# Patient Record
Sex: Male | Born: 1965 | Race: White | Hispanic: No | Marital: Married | Smoking: Current every day smoker
Health system: Southern US, Community
[De-identification: ages and names within clinical notes are randomized; demographics above are authoritative.]

## PROBLEM LIST (undated history)

## (undated) DIAGNOSIS — J42 Unspecified chronic bronchitis: Secondary | ICD-10-CM

## (undated) DIAGNOSIS — J449 Chronic obstructive pulmonary disease, unspecified: Secondary | ICD-10-CM

## (undated) DIAGNOSIS — N529 Male erectile dysfunction, unspecified: Secondary | ICD-10-CM

## (undated) DIAGNOSIS — I10 Essential (primary) hypertension: Secondary | ICD-10-CM

## (undated) DIAGNOSIS — R739 Hyperglycemia, unspecified: Secondary | ICD-10-CM

## (undated) DIAGNOSIS — Z720 Tobacco use: Secondary | ICD-10-CM

## (undated) HISTORY — DX: Male erectile dysfunction, unspecified: N52.9

## (undated) HISTORY — DX: Hyperglycemia, unspecified: R73.9

## (undated) HISTORY — DX: Unspecified chronic bronchitis: J42

## (undated) HISTORY — DX: Essential (primary) hypertension: I10

## (undated) HISTORY — DX: Chronic obstructive pulmonary disease, unspecified: J44.9

## (undated) HISTORY — DX: Tobacco use: Z72.0

---

## 2010-08-08 ENCOUNTER — Other Ambulatory Visit (HOSPITAL_COMMUNITY): Payer: Self-pay | Admitting: Pulmonary Disease

## 2010-08-08 DIAGNOSIS — R110 Nausea: Secondary | ICD-10-CM

## 2010-08-08 DIAGNOSIS — R140 Abdominal distension (gaseous): Secondary | ICD-10-CM

## 2010-08-14 ENCOUNTER — Ambulatory Visit (HOSPITAL_COMMUNITY)
Admission: RE | Admit: 2010-08-14 | Discharge: 2010-08-14 | Disposition: A | Discharge status: Home / Self Care | Payer: BC Managed Care – PPO | Source: Ambulatory Visit | Attending: Pulmonary Disease | Admitting: Pulmonary Disease

## 2010-08-14 DIAGNOSIS — R932 Abnormal findings on diagnostic imaging of liver and biliary tract: Secondary | ICD-10-CM | POA: Insufficient documentation

## 2010-08-14 DIAGNOSIS — R110 Nausea: Secondary | ICD-10-CM

## 2010-08-14 DIAGNOSIS — R141 Gas pain: Secondary | ICD-10-CM | POA: Insufficient documentation

## 2010-08-14 DIAGNOSIS — R142 Eructation: Secondary | ICD-10-CM | POA: Insufficient documentation

## 2010-08-14 DIAGNOSIS — R140 Abdominal distension (gaseous): Secondary | ICD-10-CM

## 2010-09-04 ENCOUNTER — Other Ambulatory Visit (HOSPITAL_COMMUNITY): Payer: Self-pay | Admitting: Pulmonary Disease

## 2010-09-04 DIAGNOSIS — R110 Nausea: Secondary | ICD-10-CM

## 2010-09-10 ENCOUNTER — Encounter (HOSPITAL_COMMUNITY): Payer: Self-pay

## 2010-09-10 ENCOUNTER — Encounter (HOSPITAL_COMMUNITY)
Admission: RE | Admit: 2010-09-10 | Discharge: 2010-09-10 | Disposition: A | Discharge status: Home / Self Care | Payer: BC Managed Care – PPO | Source: Ambulatory Visit | Attending: Pulmonary Disease | Admitting: Pulmonary Disease

## 2010-09-10 DIAGNOSIS — R110 Nausea: Secondary | ICD-10-CM

## 2010-09-10 DIAGNOSIS — R109 Unspecified abdominal pain: Secondary | ICD-10-CM | POA: Insufficient documentation

## 2010-09-10 MED ORDER — TECHNETIUM TC 99M MEBROFENIN IV KIT
5.0000 | PACK | Freq: Once | INTRAVENOUS | Status: AC | PRN
  Administered 2010-09-10: 5.5 via INTRAVENOUS

## 2010-10-08 ENCOUNTER — Encounter (HOSPITAL_COMMUNITY): Payer: BC Managed Care – PPO

## 2010-10-08 ENCOUNTER — Other Ambulatory Visit: Payer: Self-pay | Admitting: General Surgery

## 2010-10-08 LAB — HEPATIC FUNCTION PANEL
Indirect Bilirubin: 0.4 mg/dL (ref 0.3–0.9)
Total Protein: 6.5 g/dL (ref 6.0–8.3)

## 2010-10-08 LAB — CBC
MCH: 30.7 pg (ref 26.0–34.0)
MCHC: 34 g/dL (ref 30.0–36.0)
MCV: 90.2 fL (ref 78.0–100.0)
Platelets: 268 10*3/uL (ref 150–400)
RDW: 12.5 % (ref 11.5–15.5)

## 2010-10-08 LAB — BASIC METABOLIC PANEL
GFR calc non Af Amer: >60 mL/min (ref >60)
Glucose, Bld: 103 mg/dL — ABNORMAL HIGH (ref 70–99)
Potassium: 4.1 mEq/L (ref 3.5–5.1)
Sodium: 140 mEq/L (ref 135–145)

## 2010-10-08 LAB — DIFFERENTIAL
Basophils Absolute: 0.1 10*3/uL (ref 0.0–0.1)
Eosinophils Absolute: 0.2 10*3/uL (ref 0.0–0.7)
Eosinophils Relative: 3 % (ref 0–5)

## 2010-10-08 LAB — SURGICAL PCR SCREEN: Staphylococcus aureus: NEGATIVE

## 2010-10-16 ENCOUNTER — Other Ambulatory Visit (HOSPITAL_COMMUNITY): Payer: BC Managed Care – PPO

## 2010-10-19 ENCOUNTER — Other Ambulatory Visit: Payer: Self-pay | Admitting: General Surgery

## 2010-10-19 ENCOUNTER — Observation Stay (HOSPITAL_COMMUNITY)
Admission: RE | Admit: 2010-10-19 | Discharge: 2010-10-20 | Disposition: A | Discharge status: Home / Self Care | Payer: BC Managed Care – PPO | Source: Ambulatory Visit | Attending: General Surgery | Admitting: General Surgery

## 2010-10-19 DIAGNOSIS — K819 Cholecystitis, unspecified: Principal | ICD-10-CM | POA: Insufficient documentation

## 2010-10-19 DIAGNOSIS — Z79899 Other long term (current) drug therapy: Secondary | ICD-10-CM | POA: Insufficient documentation

## 2010-10-19 DIAGNOSIS — R1011 Right upper quadrant pain: Secondary | ICD-10-CM | POA: Insufficient documentation

## 2010-10-19 DIAGNOSIS — R110 Nausea: Secondary | ICD-10-CM | POA: Insufficient documentation

## 2010-10-20 LAB — DIFFERENTIAL
Basophils Absolute: 0 10*3/uL (ref 0.0–0.1)
Basophils Relative: 0 % (ref 0–1)
Monocytes Relative: 10 % (ref 3–12)
Neutro Abs: 10.5 10*3/uL — ABNORMAL HIGH (ref 1.7–7.7)
Neutrophils Relative %: 75 % (ref 43–77)

## 2010-10-20 LAB — COMPREHENSIVE METABOLIC PANEL
ALT: 49 U/L (ref 0–53)
AST: 45 U/L — ABNORMAL HIGH (ref 0–37)
CO2: 28 mEq/L (ref 19–32)
Chloride: 103 mEq/L (ref 96–112)
GFR calc Af Amer: >60 mL/min (ref >60)
GFR calc non Af Amer: >60 mL/min (ref >60)
Potassium: 3.9 mEq/L (ref 3.5–5.1)
Sodium: 139 mEq/L (ref 135–145)
Total Bilirubin: 0.7 mg/dL (ref 0.3–1.2)

## 2010-10-20 LAB — CBC
Hemoglobin: 13.5 g/dL (ref 13.0–17.0)
RBC: 4.49 MIL/uL (ref 4.22–5.81)
WBC: 14.1 10*3/uL — ABNORMAL HIGH (ref 4.0–10.5)

## 2010-10-31 NOTE — Op Note (Signed)
Donald Chavez, Donald Chavez               ACCOUNT NO.:  [id number]  MEDICAL RECORD NO.:  [id number]           PATIENT TYPE:  O  LOCATION:  A340                          FACILITY:  [facility]  PHYSICIAN:  Barbaraann Barthel, M.D. 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  PROCEDURE:  Laparoscopic cholecystectomy (no cholangiogram).  Note, this is a 45 year old white male who had postprandial pain located in the epigastrium and radiated to the back.  It was worked up by the medical service and was noted to have a sonogram that had no stones noted within it.  He had a hepatobiliary scan, which was very positive for symptoms for biliary dyskinesia having a ejection fraction less than1.  He was referred for gallbladder surgery.  Clinically, his symptoms were that of gallbladder disease.  I discussed complications with him discussing bleeding, infection, damage to bile ducts, perforation of organs, and transitory diarrhea.  I also I discussed with him candidly that the results for cholecystectomy, for biliary dyskinesia were not as satisfying as for cholelithiasis.  We discussed this in detail and informed consent was obtained.  GROSS OPERATIVE FINDINGS:  The patient had some dilated loops of small bowel.  The gallbladder had a long cystic duct, which was not cannulated.  There were no stones within the gallbladder.  The right upper quadrant otherwise appeared normal, and there were no other abnormalities noted.  TECHNIQUE:  The patient was placed in supine position. After the adequate administration of general anesthesia via enddotrachial intubation, a Foley catheter was placed.  A periumbilical incision was carried out over the  superior aspect of the umbilicus.  The fascia was grasped with a sharp towel clip and elevated.  A Veress needle was inserted, and a saline drop test was performed.  This was a little sluggish.  I reinserted this again after infusing.  However, I did not like the rate at which the infusions were carried out and reinserted this.  We were in the round ligament, which insufflated, and we repositioned this, and there was no problem with flow.  We then used the Visiport technique and placed 11-mm cannulae in the umbilicus and then another 11-mm cannula in the epigastrium, and two 5-mm cannulas in right upper quadrant laterally.  The gallbladder was grasped.  Its adhesions were taken down, and the cystic duct was clearly visualized and triply silver clipped and divided, as was the cystic artery.  This was then removed from the liver bed with very minimal spillage using the hook cautery device.  I irrigated copiously with normal saline solution.  There were no problems.  I elected to leave a Surgicel within the liver bed and a Jackson-Pratt drain there as well. I looked at the dilated loops of small bowel.  There was no evidence of any trauma to this, and he had some minimally dilated loops of small intestine, but there was obviously no trauma  from insertion of the needle.  There was obviously some bullous changes of the parietal peritoneum.  There was no problem.  After checking for hemostasis and checking for any abnormalities, we then desufflated the abdomen and then closed the fascia in the area of the epigastrium and the umbilicus with 0 Polysorb and then used 0.5% Sensorcaine at all drain sites, and closed the skin with a stapling device.  The drain was sutured in place with 3- 0 nylon and prior to closure, all sponge, needle, and instrument counts were found to be correct.  The patient received approximately 1200 mL of crystalloids intraoperatively.  There was minimal spillage.  The  wound classification is clean-contaminated (minimal bile spillage).  The specimen was a gallbladder. The patient was taken to the recovery room in satisfactory condition.  There were no complications.     Barbaraann Barthel, M.D.     WB/MEDQ  D:  10/19/2010  T:  10/20/2010  Job:  [id number]  Electronically Signed by Barbaraann Barthel M.D. on 10/31/2010 01:47:29 PM

## 2010-10-31 NOTE — Discharge Summary (Signed)
  [name], Donald Chavez               ACCOUNT NO.:  [id number]  MEDICAL RECORD NO.:  [id number]           PATIENT TYPE:  O  LOCATION:  A340                          FACILITY:  [facility]  PHYSICIAN:  Barbaraann Barthel, M.D. 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  PROCEDURE:  On October 19, 2010, laparoscopic cholecystectomy.  NOTE:  This is a 45 year old white male who had recurrent episodes of right upper quadrant pain and nausea that was postprandial in nature. It was located in the epigastrium and radiated to the back.  He was worked up by the medical service and referred to me for cholecystectomy. In essence, his sonogram was negative.  His liver function studies were grossly within normal limits.  However, his hepatobiliary scan showed that he had less than 1% ejection fraction and we discussed the need for laparoscopic cholecystectomy when dietary manipulation did not help him. We discussed complications not limited to, but including bleeding, infection, damage to bile ducts, perforation of organs and transitory diarrhea.  We also discussed that cholecystectomy for biliary dyskinesia does not have results as good as for cholelithiasis.  Informed consent was obtained.  The patient was taken to surgery via the outpatient department.  This was completely uneventful.  He was discharged on the following first postoperative day at which time he was tolerating p.o. well.  He was afebrile.  His wound was clean.  His abdomen was soft.  He had minimal serosanguineous JP drainage which was removed prior to discharge and he had no leg pain, shortness of breath or dysuria.  His hospital course was completely uneventful and he was doing quite well postoperatively.  DISCHARGE INSTRUCTIONS:  He was discharged on a full liquid and soft diet.  He is told  to do no heavy lifting greater than 10 or 15 pounds. He is permitted to go indoors and outdoors and up and down the stairs. He is told to do no driving and no sexual activity in the immediate postoperative period, told to keep his wound clean with alcohol 3 times a day and change his drain site as needed.  He is to follow up with me on October 26, 2010, at 10 a.m.  He is to call me or come to the emergency room should there be any acute changes.     Barbaraann Barthel, M.D.     WB/MEDQ  D:  10/20/2010  T:  10/20/2010  Job:  [id number]  cc:   Ramon Dredge L. Juanetta Gosling, M.D. Fax: [phone]  Electronically Signed by Barbaraann Barthel M.D. on 10/31/2010 01:48:56 PM

## 2013-03-12 IMAGING — NM NM HEPATO W/GB/PHARM/[PERSON_NAME]
2 series · 12 of 12 positions shown · non-contrast
Comparison: Abdominal ultrasound 08/14/2010.

CLINICAL DATA: Abdominal pain and nausea.

NUCLEAR MEDICINE HEPATOBILIARY IMAGING WITH GALLBLADDER EF
TECHNIQUE: Sequential images of the abdomen were obtained [DATE]
minutes following intravenous administration of
radiopharmaceutical.  After the slow intravenous infusion of
uCg Cholecystokinin, the gallbladder ejection fraction was
determined.
Radiopharmaceutical:  5.5 mCi Gc-MMm Choletec

[gb hepatobiliary scan · 3.19mm/px · 6 of 60 frames shown (1 of 2)]
[frame 6/60]
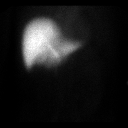
[frame 16/60]
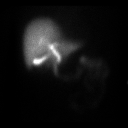
[frame 26/60]
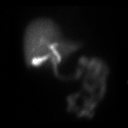
[frame 36/60]
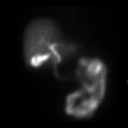
[frame 46/60]
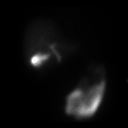
[frame 56/60]
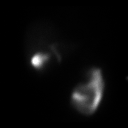

[gb hepatobiliary scan · 3.19mm/px · 6 of 30 frames shown (2 of 2)]
[frame 3/30]
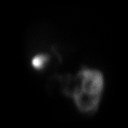
[frame 8/30]
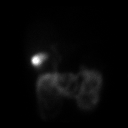
[frame 13/30]
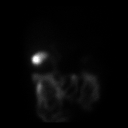
[frame 18/30]
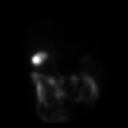
[frame 23/30]
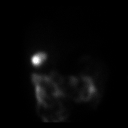
[frame 28/30]
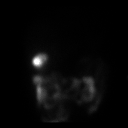

[12 of 12 positions shown; findings below may reference images not displayed]

FINDINGS: Initial images demonstrate homogenous hepatic activity
and prompt visualization of the gallbladder and biliary system.

The stimulated portion of the study demonstrates progressive small
bowel activity but effectively no gallbladder contraction.  The
gallbladder ejection fraction calculated at approximately 30
minutes is 0.9%.  Normal ejection fractions are considered greater
than 30%.

The patient did not experience symptoms  during CCK administration.
IMPRESSION: 1.  The cystic and common bile ducts are patent.
2.  There is effectively no gallbladder contraction following
cholecystokinin administration.  This did not reproduce the
patient's symptoms however.

## 2018-04-15 ENCOUNTER — Ambulatory Visit (INDEPENDENT_AMBULATORY_CARE_PROVIDER_SITE_OTHER): Payer: PRIVATE HEALTH INSURANCE | Admitting: Internal Medicine

## 2018-04-15 ENCOUNTER — Other Ambulatory Visit (INDEPENDENT_AMBULATORY_CARE_PROVIDER_SITE_OTHER): Payer: PRIVATE HEALTH INSURANCE

## 2018-04-15 ENCOUNTER — Encounter: Payer: Self-pay | Admitting: Internal Medicine

## 2018-04-15 ENCOUNTER — Ambulatory Visit (INDEPENDENT_AMBULATORY_CARE_PROVIDER_SITE_OTHER)
Admission: RE | Admit: 2018-04-15 | Discharge: 2018-04-15 | Disposition: A | Discharge status: Home / Self Care | Payer: PRIVATE HEALTH INSURANCE | Source: Ambulatory Visit | Attending: Internal Medicine | Admitting: Internal Medicine

## 2018-04-15 VITALS — BP 136/80 | HR 68 | Temp 98.9°F | Ht 77.0 in | Wt 204.2 lb

## 2018-04-15 DIAGNOSIS — Z0000 Encounter for general adult medical examination without abnormal findings: Secondary | ICD-10-CM

## 2018-04-15 DIAGNOSIS — R05 Cough: Secondary | ICD-10-CM

## 2018-04-15 DIAGNOSIS — R739 Hyperglycemia, unspecified: Secondary | ICD-10-CM

## 2018-04-15 DIAGNOSIS — R059 Cough, unspecified: Secondary | ICD-10-CM

## 2018-04-15 DIAGNOSIS — Z23 Encounter for immunization: Secondary | ICD-10-CM | POA: Diagnosis not present

## 2018-04-15 DIAGNOSIS — M25511 Pain in right shoulder: Secondary | ICD-10-CM | POA: Diagnosis not present

## 2018-04-15 DIAGNOSIS — F1210 Cannabis abuse, uncomplicated: Secondary | ICD-10-CM

## 2018-04-15 DIAGNOSIS — J410 Simple chronic bronchitis: Secondary | ICD-10-CM | POA: Diagnosis not present

## 2018-04-15 DIAGNOSIS — Z1211 Encounter for screening for malignant neoplasm of colon: Secondary | ICD-10-CM | POA: Insufficient documentation

## 2018-04-15 DIAGNOSIS — G8929 Other chronic pain: Secondary | ICD-10-CM

## 2018-04-15 DIAGNOSIS — Z720 Tobacco use: Secondary | ICD-10-CM

## 2018-04-15 LAB — CBC WITH DIFFERENTIAL/PLATELET
BASOS ABS: 0 10*3/uL (ref 0.0–0.1)
BASOS PCT: 0.3 % (ref 0.0–3.0)
EOS ABS: 0.3 10*3/uL (ref 0.0–0.7)
Eosinophils Relative: 2.7 % (ref 0.0–5.0)
HEMATOCRIT: 45.4 % (ref 39.0–52.0)
Hemoglobin: 15.4 g/dL (ref 13.0–17.0)
LYMPHS ABS: 3.1 10*3/uL (ref 0.7–4.0)
LYMPHS PCT: 31.4 % (ref 12.0–46.0)
MCHC: 34 g/dL (ref 30.0–36.0)
MCV: 87.8 fl (ref 78.0–100.0)
MONOS PCT: 8.9 % (ref 3.0–12.0)
Monocytes Absolute: 0.9 10*3/uL (ref 0.1–1.0)
NEUTROS ABS: 5.6 10*3/uL (ref 1.4–7.7)
NEUTROS PCT: 56.7 % (ref 43.0–77.0)
PLATELETS: 311 10*3/uL (ref 150.0–400.0)
RBC: 5.17 Mil/uL (ref 4.22–5.81)
RDW: 13 % (ref 11.5–15.5)
WBC: 9.8 10*3/uL (ref 4.0–10.5)

## 2018-04-15 LAB — COMPREHENSIVE METABOLIC PANEL
ALBUMIN: 4.4 g/dL (ref 3.5–5.2)
ALT: 17 U/L (ref 0–53)
AST: 15 U/L (ref 0–37)
Alkaline Phosphatase: 61 U/L (ref 39–117)
BUN: 20 mg/dL (ref 6–23)
CALCIUM: 9.2 mg/dL (ref 8.4–10.5)
CHLORIDE: 104 meq/L (ref 96–112)
CO2: 29 meq/L (ref 19–32)
Creatinine, Ser: 0.99 mg/dL (ref 0.40–1.50)
GFR: 83.99 mL/min (ref >60.00)
Glucose, Bld: 100 mg/dL — ABNORMAL HIGH (ref 70–99)
Potassium: 4.1 mEq/L (ref 3.5–5.1)
Sodium: 139 mEq/L (ref 135–145)
Total Bilirubin: 0.4 mg/dL (ref 0.2–1.2)
Total Protein: 7 g/dL (ref 6.0–8.3)

## 2018-04-15 LAB — LIPID PANEL
CHOL/HDL RATIO: 5
CHOLESTEROL: 168 mg/dL (ref 0–200)
HDL: 36.7 mg/dL — ABNORMAL LOW (ref >39.00)
NonHDL: 130.92
TRIGLYCERIDES: 206 mg/dL — AB (ref 0.0–149.0)
VLDL: 41.2 mg/dL — AB (ref 0.0–40.0)

## 2018-04-15 LAB — HEMOGLOBIN A1C: Hgb A1c MFr Bld: 5.7 % (ref 4.6–6.5)

## 2018-04-15 LAB — PSA: PSA: 0.61 ng/mL (ref 0.10–4.00)

## 2018-04-15 LAB — LDL CHOLESTEROL, DIRECT: Direct LDL: 101 mg/dL

## 2018-04-15 NOTE — Patient Instructions (Signed)

## 2018-04-15 NOTE — Progress Notes (Signed)
Subjective:  Patient ID: Donald Chavez, male    DOB: 11/20/1965  Age: 52 y.o. MRN: [id number]  CC: Cough and Annual Exam  NEW TO ME  HPI Donald Chavez presents for a CPX.  He complains of chronic nonproductive cough with rare episodes of shortness of breath.  He denies CP, DOE, palpitations, diaphoresis, dizziness, or lightheadedness.  History Arlys JohnBrian has no past medical history on file.   He has no past surgical history on file.   His family history includes Alcoholism in his father; CAD in his father; COPD in his father; Hypertension in his mother.He reports that he has been smoking cigarettes. He has a 49.50 pack-year smoking history. He has never used smokeless tobacco. He reports that he drinks about 5.0 standard drinks of alcohol per week. He reports that he has current or past drug history. Drug: Marijuana.  No outpatient medications prior to visit.   No facility-administered medications prior to visit.     ROS Review of Systems  Constitutional: Negative.  Negative for chills, diaphoresis, fatigue and unexpected weight change.  HENT: Negative.  Negative for trouble swallowing.   Eyes: Negative for visual disturbance.  Respiratory: Positive for cough and shortness of breath. Negative for chest tightness and wheezing.   Cardiovascular: Negative for chest pain, palpitations and leg swelling.  Gastrointestinal: Negative for abdominal pain, blood in stool, constipation, diarrhea, nausea and vomiting.  Genitourinary: Negative.  Negative for difficulty urinating, dysuria, scrotal swelling, testicular pain and urgency.  Musculoskeletal: Positive for arthralgias.       He complains of chronic, nontraumatic right shoulder pain he wants to see a specialist about this.  Skin: Negative.   Neurological: Negative.  Negative for dizziness, weakness and light-headedness.  Hematological: Negative for adenopathy. Does not bruise/bleed easily.  Psychiatric/Behavioral: Negative.      Objective:  BP 136/80 (BP Location: Left Arm, Patient Position: Sitting, Cuff Size: Normal)   Pulse 68   Temp 98.9 F (37.2 C) (Oral)   Ht 6\' 5"  (1.956 m)   Wt 204 lb 4 oz (92.6 kg)   SpO2 94%   BMI 24.22 kg/m   Physical Exam  Constitutional: He is oriented to person, place, and time. No distress.  HENT:  Mouth/Throat: Oropharynx is clear and moist. No oropharyngeal exudate.  Eyes: Conjunctivae are normal. No scleral icterus.  Neck: Normal range of motion. Neck supple. No JVD present. No thyromegaly present.  Cardiovascular: Normal rate, regular rhythm and normal heart sounds.  No murmur heard. Pulmonary/Chest: Effort normal. No respiratory distress. He has no decreased breath sounds. He has no wheezes. He has rhonchi in the right lower field and the left lower field. He has no rales.  There are faint inspiratory rhonchi heard symmetrically in both bases.  Abdominal: Soft. Bowel sounds are normal. He exhibits no mass. There is no hepatosplenomegaly. There is no tenderness. Hernia confirmed negative in the right inguinal area and confirmed negative in the left inguinal area.  Genitourinary: Rectum normal, prostate normal, testes normal and penis normal. Rectal exam shows no external hemorrhoid, no internal hemorrhoid, no fissure, no mass, no tenderness, anal tone normal and guaiac negative stool. Prostate is not enlarged and not tender. Right testis shows no mass, no swelling and no tenderness. Left testis shows no mass, no swelling and no tenderness. Circumcised. No penile erythema or penile tenderness. No discharge found.  Musculoskeletal: Normal range of motion. He exhibits no edema, tenderness or deformity.       Right shoulder: Normal.  Lymphadenopathy:    He has no cervical adenopathy. No inguinal adenopathy noted on the right or left side.  Neurological: He is alert and oriented to person, place, and time.  Skin: Skin is warm and dry. No rash noted. He is not diaphoretic.   Psychiatric: He has a normal mood and affect. His behavior is normal. Judgment and thought content normal.  Vitals reviewed.   Lab Results  Component Value Date   WBC 9.8 04/15/2018   HGB 15.4 04/15/2018   HCT 45.4 04/15/2018   PLT 311.0 04/15/2018   GLUCOSE 100 (H) 04/15/2018   CHOL 168 04/15/2018   TRIG 206.0 (H) 04/15/2018   HDL 36.70 (L) 04/15/2018   LDLDIRECT 101.0 04/15/2018   ALT 17 04/15/2018   AST 15 04/15/2018   NA 139 04/15/2018   K 4.1 04/15/2018   CL 104 04/15/2018   CREATININE 0.99 04/15/2018   BUN 20 04/15/2018   CO2 29 04/15/2018   PSA 0.61 04/15/2018   HGBA1C 5.7 04/15/2018    Assessment & Plan:   Krue was seen today for cough and annual exam.  Diagnoses and all orders for this visit:  Routine general medical examination at a health care facility- Exam completed, labs reviewed, vaccines reviewed and updated, to screen for colon cancer/polyps Cologuard was ordered, patient education material was given. -     Lipid panel; Future -     PSA; Future  Hyperglycemia -     Comprehensive metabolic panel; Future -     Hemoglobin A1c; Future  Need for influenza vaccination -     Flu Vaccine QUAD 36+ mos IM  Need for Tdap vaccination -     Tdap vaccine greater than or equal to 7yo IM  Cough- His chest x-ray is positive for signs of COPD but no mass or infiltrate. -     CBC with Differential/Platelet; Future -     DG Chest 2 View; Future  Chronic right shoulder pain -     Ambulatory referral to Sports Medicine  Colon cancer screening -     Cologuard  Tobacco abuse  Marijuana abuse, continuous  Simple chronic bronchitis (HCC)- He was asked to quit smoking.  He says his symptoms are not significant enough for him to use an inhaler to treat this.   Donald Drone Marquart does not currently have medications on file.  No orders of the defined types were placed in this encounter.    Follow-up: Return in about 3 months (around 07/16/2018).  Sanda Linger, MD

## 2018-04-17 ENCOUNTER — Encounter: Payer: Self-pay | Admitting: Internal Medicine

## 2018-04-17 DIAGNOSIS — F1210 Cannabis abuse, uncomplicated: Secondary | ICD-10-CM | POA: Insufficient documentation

## 2018-04-17 DIAGNOSIS — Z720 Tobacco use: Secondary | ICD-10-CM | POA: Insufficient documentation

## 2018-04-17 DIAGNOSIS — Z23 Encounter for immunization: Secondary | ICD-10-CM | POA: Insufficient documentation

## 2018-04-17 DIAGNOSIS — J410 Simple chronic bronchitis: Secondary | ICD-10-CM | POA: Insufficient documentation

## 2018-05-20 NOTE — Progress Notes (Signed)
[facility] [address] [place], [place] [address] Phone: [phone] Subjective:   Bruce Donath, am serving as a scribe for Dr. Antoine Primas.  I'm seeing this patient by the request  of:  Etta Grandchild, MD   CC: right shoulder pain   DDU:[id number]  Donald Chavez is a 53 y.o. male coming in with complaint of right shoulder pain. Pain has been occurring for past 2 months. No mechanism of injury. Does pick up diabled wife and feels pain may have come from taking care of her. Does have pain with flexion but pain is also present with his arm at his side. Does use NSAIDs and ice. Denies any radiating symptoms. Pain is located in posterior shoulder.  Patient has noted some decreased range of motion.  Waking him up at night.  Rates the severity of pain is 9 out of 10 sometimes      No past medical history on file. No past surgical history on file. Social History   Socioeconomic History  . Marital status: Married    Spouse name: Not on file  . Number of children: Not on file  . Years of education: Not on file  . Highest education level: Not on file  Occupational History  . Not on file  Social Needs  . Financial resource strain: Not on file  . Food insecurity:    Worry: Not on file    Inability: Not on file  . Transportation needs:    Medical: Not on file    Non-medical: Not on file  Tobacco Use  . Smoking status: Current Every Day Smoker    Packs/day: 1.50    Years: 33.00    Pack years: 49.50    Types: Cigarettes  . Smokeless tobacco: Never Used  Substance and Sexual Activity  . Alcohol use: Yes    Alcohol/week: 5.0 standard drinks    Types: 5 Shots of liquor per week  . Drug use: Yes    Types: Marijuana  . Sexual activity: Not Currently    Partners: Female  Lifestyle  . Physical activity:    Days per week: Not on file    Minutes per session: Not on file  . Stress: Not on file  Relationships  . Social connections:    Talks on  phone: Not on file    Gets together: Not on file    Attends religious service: Not on file    Active member of club or organization: Not on file    Attends meetings of clubs or organizations: Not on file    Relationship status: Not on file  Other Topics Concern  . Not on file  Social History Narrative  . Not on file   No Known Allergies Family History  Problem Relation Age of Onset  . Hypertension Mother   . COPD Father   . CAD Father   . Alcoholism Father    No current outpatient medications on file.    Past medical history, social, surgical and family history all reviewed in electronic medical record.  No pertanent information unless stated regarding to the chief complaint.   Review of Systems:  No headache, visual changes, nausea, vomiting, diarrhea, constipation, dizziness, abdominal pain, skin rash, fevers, chills, night sweats, weight loss, swollen lymph nodes, body aches, joint swelling, muscle aches, chest pain, shortness of breath, mood changes.   Objective  Blood pressure 118/72, pulse 64, height 6\' 5"  (1.956 m), weight 203 lb (92.1  kg), SpO2 97 %.    General: No apparent distress alert and oriented x3 mood and affect normal, dressed appropriately.  HEENT: Pupils equal, extraocular movements intact  Respiratory: Patient's speak in full sentences and does not appear short of breath  Cardiovascular: No lower extremity edema, non tender, no erythema  Skin: Warm dry intact with no signs of infection or rash on extremities or on axial skeleton.  Abdomen: Soft nontender  Neuro: Cranial nerves II through XII are intact, neurovascularly intact in all extremities with 2+ DTRs and 2+ pulses.  Lymph: No lymphadenopathy of posterior or anterior cervical chain or axillae bilaterally.  Gait normal with good balance and coordination.  MSK:  Non tender with full range of motion and good stability and symmetric strength and tone of  elbows, wrist, hip, knee and ankles bilaterally.    Shoulder: Right Inspection reveals no abnormalities, atrophy or asymmetry. Diffuse tenderness to palpation Patient has limited range of motion with only 5 degrees of external rotation and internal rotation anterior lateral hip.  Forward flexion 120 degrees. Rotator cuff strength normal throughout. signs of impingement with positive Neer and Hawkin's tests, but negative empty can sign. Speeds and Yergason's tests normal. No labral pathology noted with negative Obrien's, negative clunk and good stability. Mild scapular dysfunction noted. No painful arc and no drop arm sign. No apprehension sign Contralateral shoulder unremarkable  MSK US performed of: Right This study was ordered, performed, and interpreted by Terrilee FilesZach Tarnesha Ulloa D.O.  Shoulder:   Supraspinatus:  Appears normal on long and transverse views, Bursal bulge seen with shoulder abduction on impingement view.  Significant thickening of the anterior capsule. Minimal arthritic changes noted.  Subscapularis unremarkable but does have some thickness.   Impression: Subacromial bursitis  Procedure: Real-time Ultrasound Guided Injection of right glenohumeral joint Device: GE Logiq E  Ultrasound guided injection is preferred based studies that show increased duration, increased effect, greater accuracy, decreased procedural pain, increased response rate with ultrasound guided versus blind injection.  Verbal informed consent obtained.  Time-out conducted.  Noted no overlying erythema, induration, or other signs of local infection.  Skin prepped in a sterile fashion.  Local anesthesia: Topical Ethyl chloride.  With sterile technique and under real time ultrasound guidance:  Joint visualized.  23g 1  inch needle inserted posterior approach. Pictures taken for needle placement. Patient did have injection of 2 cc of 1% lidocaine, 2 cc of 0.5% Marcaine, and 1.0 cc of Kenalog 40 mg/dL. Completed without difficulty  Pain immediately resolved  suggesting accurate placement of the medication.  Advised to call if fevers/chills, erythema, induration, drainage, or persistent bleeding.  Images permanently stored and available for review in the ultrasound unit.  Impression: Technically successful ultrasound guided injection.   Impression and Recommendations:     This case required medical decision making of moderate complexity. The above documentation has been reviewed and is accurate and complete Judi SaaZachary M Chamar Broughton, DO       Note: This dictation was prepared with Dragon dictation along with smaller phrase technology. Any transcriptional errors that result from this process are unintentional.

## 2018-05-21 ENCOUNTER — Ambulatory Visit: Payer: Self-pay

## 2018-05-21 ENCOUNTER — Encounter: Payer: Self-pay | Admitting: Family Medicine

## 2018-05-21 ENCOUNTER — Ambulatory Visit (INDEPENDENT_AMBULATORY_CARE_PROVIDER_SITE_OTHER): Payer: PRIVATE HEALTH INSURANCE | Admitting: Family Medicine

## 2018-05-21 VITALS — BP 118/72 | HR 64 | Ht 77.0 in | Wt 203.0 lb

## 2018-05-21 DIAGNOSIS — G8929 Other chronic pain: Secondary | ICD-10-CM

## 2018-05-21 DIAGNOSIS — M7500 Adhesive capsulitis of unspecified shoulder: Secondary | ICD-10-CM | POA: Insufficient documentation

## 2018-05-21 DIAGNOSIS — M25511 Pain in right shoulder: Secondary | ICD-10-CM

## 2018-05-21 DIAGNOSIS — M7501 Adhesive capsulitis of right shoulder: Secondary | ICD-10-CM

## 2018-05-21 NOTE — Assessment & Plan Note (Signed)
Patient given injection.  Tolerated the procedure well.  Discussed icing regimen and home exercise.  Discussed which activities to do which wants to avoid.  Increase activity slowly over the course of next several days.  Follow-up with me again in 4 to 8 weeks.  Differential includes osteoarthritic changes but on ultrasound did not see.  Rotator cuff appeared to be intact.

## 2018-05-21 NOTE — Patient Instructions (Addendum)
Good to see you  Injected the shoulder for frozen shoulder  Ice 20 minutes 2 times daily. Usually after activity and before bed. pennsaid pinkie amount topically 2 times daily as needed.  Exercises 3 times a week. Try to move the shoulder daily  See me again in 5-6 weeks

## 2018-06-25 ENCOUNTER — Ambulatory Visit (INDEPENDENT_AMBULATORY_CARE_PROVIDER_SITE_OTHER): Payer: PRIVATE HEALTH INSURANCE | Admitting: Family Medicine

## 2018-06-25 ENCOUNTER — Encounter: Payer: Self-pay | Admitting: Family Medicine

## 2018-06-25 DIAGNOSIS — M25511 Pain in right shoulder: Secondary | ICD-10-CM

## 2018-06-25 DIAGNOSIS — G8929 Other chronic pain: Secondary | ICD-10-CM

## 2018-06-25 NOTE — Patient Instructions (Signed)
Good to see you Donald Chavez is your friend  Try to do the exercises 1 time a week  Call [phone] when you need me

## 2018-06-25 NOTE — Progress Notes (Signed)
[facility] [address] [place], [place] [address] Phone: [phone] Subjective:    I Donald Chavez am serving as a Neurosurgeon for Dr. Antoine Primas.    CC: shoulder pain follow up   JOA:[id number]    Updated 05/21/2018  Patient given injection.  Tolerated the procedure well.  Discussed icing regimen and home exercise.  Discussed which activities to do which wants to avoid.  Increase activity slowly over the course of next several days.  Follow-up with me again in 4 to 8 weeks.  Differential includes osteoarthritic changes but on ultrasound did not see.  Rotator cuff appeared to be intact.  Updated 06/25/2018  Ranell Alyea Agard is a 53 y.o. male coming in with complaint of right shoulder pain. Patient states that his shoulder is feeling good today. Patient states that he is approximately 90 to 95% better.  Patient states only some mild twinges from time to time.  Not waking him up at night.     No past medical history on file. No past surgical history on file. Social History   Socioeconomic History  . Marital status: Married    Spouse name: Not on file  . Number of children: Not on file  . Years of education: Not on file  . Highest education level: Not on file  Occupational History  . Not on file  Social Needs  . Financial resource strain: Not on file  . Food insecurity:    Worry: Not on file    Inability: Not on file  . Transportation needs:    Medical: Not on file    Non-medical: Not on file  Tobacco Use  . Smoking status: Current Every Day Smoker    Packs/day: 1.50    Years: 33.00    Pack years: 49.50    Types: Cigarettes  . Smokeless tobacco: Never Used  Substance and Sexual Activity  . Alcohol use: Yes    Alcohol/week: 5.0 standard drinks    Types: 5 Shots of liquor per week  . Drug use: Yes    Types: Marijuana  . Sexual activity: Not Currently    Partners: Female  Lifestyle  . Physical activity:    Days per week: Not on file    Minutes per session: Not on file  . Stress: Not on file  Relationships  . Social connections:    Talks on phone: Not on file    Gets together: Not on file    Attends religious service: Not on file    Active member of club or organization: Not on file    Attends meetings of clubs or organizations: Not on file    Relationship status: Not on file  Other Topics Concern  . Not on file  Social History Narrative  . Not on file   No Known Allergies Family History  Problem Relation Age of Onset  . Hypertension Mother   . COPD Father   . CAD Father   . Alcoholism Father    No current outpatient medications on file.    Past medical history, social, surgical and family history all reviewed in electronic medical record.  No pertanent information unless stated regarding to the chief complaint.   Review of Systems:  No headache, visual changes, nausea, vomiting, diarrhea, constipation, dizziness, abdominal pain, skin rash, fevers, chills, night sweats, weight loss, swollen lymph nodes, body aches, joint swelling, muscle aches, chest pain, shortness of breath, mood changes.   Objective  There were no vitals  taken for this visit. Systems examined below as of    General: No apparent distress alert and oriented x3 mood and affect normal, dressed appropriately.  HEENT: Pupils equal, extraocular movements intact  Respiratory: Patient's speak in full sentences and does not appear short of breath  Cardiovascular: No lower extremity edema, non tender, no erythema  Skin: Warm dry intact with no signs of infection or rash on extremities or on axial skeleton.  Abdomen: Soft nontender  Neuro: Cranial nerves II through XII are intact, neurovascularly intact in all extremities with 2+ DTRs and 2+ pulses.  Lymph: No lymphadenopathy of posterior or anterior cervical chain or axillae bilaterally.  Gait normal with good balance and coordination.  MSK:  Non tender with full range of motion and good  stability and symmetric strength and tone of , elbows, wrist, hip, knee and ankles bilaterally.  Shoulder: Right Inspection reveals no abnormalities, atrophy or asymmetry. Palpation is normal with no tenderness over AC joint or bicipital groove. Lacks last 5 degrees of forward flexion Rotator cuff strength normal throughout. Mild impingement Speeds and Yergason's tests normal. No labral pathology noted with negative Obrien's, negative clunk and good stability. Normal scapular function observed. No painful arc and no drop arm sign. No apprehension sign Shoulder shoulder unremarkable    Impression and Recommendations:    . The above documentation has been reviewed and is accurate and complete Judi Saa, DO       Note: This dictation was prepared with Dragon dictation along with smaller phrase technology. Any transcriptional errors that result from this process are unintentional.

## 2018-06-25 NOTE — Assessment & Plan Note (Addendum)
Possible frozen shoulder but no significant improvement in range of motion already.  Discussed icing regimen and home exercise.  Discussed which activities of doing which wants to avoid.  Follow-up again in 4 to 8 weeks if any worsening symptoms otherwise as needed

## 2018-09-23 ENCOUNTER — Other Ambulatory Visit: Payer: Self-pay

## 2018-09-23 ENCOUNTER — Encounter: Payer: Self-pay | Admitting: Internal Medicine

## 2018-09-23 ENCOUNTER — Ambulatory Visit (INDEPENDENT_AMBULATORY_CARE_PROVIDER_SITE_OTHER): Payer: PRIVATE HEALTH INSURANCE | Admitting: Internal Medicine

## 2018-09-23 VITALS — BP 144/86 | HR 67 | Temp 98.1°F | Resp 16 | Ht 77.0 in | Wt 200.8 lb

## 2018-09-23 DIAGNOSIS — R0789 Other chest pain: Secondary | ICD-10-CM | POA: Diagnosis not present

## 2018-09-23 DIAGNOSIS — R059 Cough, unspecified: Secondary | ICD-10-CM

## 2018-09-23 DIAGNOSIS — J441 Chronic obstructive pulmonary disease with (acute) exacerbation: Secondary | ICD-10-CM | POA: Diagnosis not present

## 2018-09-23 DIAGNOSIS — R05 Cough: Secondary | ICD-10-CM

## 2018-09-23 MED ORDER — UMECLIDINIUM-VILANTEROL 62.5-25 MCG/INH IN AEPB: 1.0000 | INHALATION_SPRAY | Freq: Every day | RESPIRATORY_TRACT | 1 refills | Status: DC

## 2018-09-23 MED ORDER — AZITHROMYCIN 500 MG PO TABS: 500.0000 mg | ORAL_TABLET | Freq: Every day | ORAL | 0 refills | Status: AC

## 2018-09-23 MED ORDER — PREDNISONE 50 MG PO TABS: 50.0000 mg | ORAL_TABLET | Freq: Every day | ORAL | 0 refills | Status: DC

## 2018-09-23 MED ORDER — ALBUTEROL SULFATE HFA 108 (90 BASE) MCG/ACT IN AERS: 2.0000 | INHALATION_SPRAY | Freq: Four times a day (QID) | RESPIRATORY_TRACT | 2 refills | Status: DC | PRN

## 2018-09-23 NOTE — Patient Instructions (Signed)

## 2018-09-23 NOTE — Progress Notes (Signed)
Subjective:  Patient ID: Donald Chavez, male    DOB: [birth year]  Age: 53 y.o. MRN: [id number]  CC: Cough   HPI Donald Chavez presents for concerns about a 2-day history of nonproductive cough, tightness under his breastbone, wheezing, and shortness of breath.  He says the tightness in his chest occurs at rest and is not exacerbated by activity.  He denies hemoptysis, diaphoresis, DOE, dizziness, lightheadedness, or nausea.  No outpatient medications prior to visit.   No facility-administered medications prior to visit.     ROS Review of Systems  Constitutional: Negative.  Negative for chills, fatigue and fever.  HENT: Negative.  Negative for sore throat and trouble swallowing.   Eyes: Negative for visual disturbance.  Respiratory: Positive for cough, chest tightness, shortness of breath and wheezing. Negative for stridor.   Cardiovascular: Negative for chest pain, palpitations and leg swelling.  Gastrointestinal: Negative for abdominal pain, constipation, diarrhea, nausea and vomiting.  Endocrine: Negative.   Genitourinary: Negative.  Negative for difficulty urinating, dysuria and hematuria.  Musculoskeletal: Negative.  Negative for arthralgias and myalgias.  Skin: Negative.  Negative for color change, pallor and rash.  Neurological: Negative.  Negative for dizziness, weakness and light-headedness.  Hematological: Negative for adenopathy. Does not bruise/bleed easily.  Psychiatric/Behavioral: Negative.     Objective:  BP (!) 144/86 (BP Location: Left Arm, Patient Position: Sitting, Cuff Size: Normal)   Pulse 67   Temp 98.1 F (36.7 C) (Oral)   Resp 16   Ht 6\' 5"  (1.956 m)   Wt 200 lb 12 oz (91.1 kg)   SpO2 95%   BMI 23.81 kg/m   BP Readings from Last 3 Encounters:  09/23/18 (!) 144/86  06/25/18 120/70  05/21/18 118/72    Wt Readings from Last 3 Encounters:  09/23/18 200 lb 12 oz (91.1 kg)  06/25/18 203 lb (92.1 kg)  05/21/18 203 lb (92.1 kg)    Physical  Exam Vitals signs reviewed.  Constitutional:      General: He is not in acute distress.    Appearance: He is not ill-appearing, toxic-appearing or diaphoretic.  HENT:     Nose: Nose normal. No congestion.     Mouth/Throat:     Mouth: Mucous membranes are moist.     Pharynx: No oropharyngeal exudate.  Eyes:     General: No scleral icterus.    Conjunctiva/sclera: Conjunctivae normal.  Neck:     Musculoskeletal: Normal range of motion and neck supple. No muscular tenderness.  Cardiovascular:     Rate and Rhythm: Normal rate and regular rhythm.     Heart sounds: No murmur. No gallop.      Comments: EKG ---  Sinus  Bradycardia  WITHIN NORMAL LIMITS Pulmonary:     Effort: Pulmonary effort is normal. No respiratory distress.     Breath sounds: No stridor. Examination of the right-middle field reveals wheezing and rhonchi. Examination of the left-middle field reveals rhonchi. Examination of the right-lower field reveals wheezing and rhonchi. Examination of the left-lower field reveals rhonchi. Wheezing and rhonchi present. No decreased breath sounds or rales.  Abdominal:     General: Abdomen is flat. Bowel sounds are normal.     Palpations: There is no mass.     Tenderness: There is no abdominal tenderness.  Musculoskeletal: Normal range of motion.     Right lower leg: No edema.     Left lower leg: No edema.  Lymphadenopathy:     Cervical: No cervical adenopathy.  Skin:  General: Skin is warm and dry.     Findings: No rash.  Neurological:     General: No focal deficit present.     Lab Results  Component Value Date   WBC 9.8 04/15/2018   HGB 15.4 04/15/2018   HCT 45.4 04/15/2018   PLT 311.0 04/15/2018   GLUCOSE 100 (H) 04/15/2018   CHOL 168 04/15/2018   TRIG 206.0 (H) 04/15/2018   HDL 36.70 (L) 04/15/2018   LDLDIRECT 101.0 04/15/2018   ALT 17 04/15/2018   AST 15 04/15/2018   NA 139 04/15/2018   K 4.1 04/15/2018   CL 104 04/15/2018   CREATININE 0.99 04/15/2018   BUN  20 04/15/2018   CO2 29 04/15/2018   PSA 0.61 04/15/2018   HGBA1C 5.7 04/15/2018    Dg Chest 2 View  Result Date: 04/15/2018 CLINICAL DATA:  Chronic cough.  Wheezing.  Smoker. EXAM: CHEST - 2 VIEW COMPARISON:  None. FINDINGS: The heart size and mediastinal contours are within normal limits. Both lungs are clear. Pulmonary hyperinflation noted, consistent with COPD. The visualized skeletal structures are unremarkable. IMPRESSION: COPD.  No active cardiopulmonary disease. Electronically Signed   By: Myles RosenthalJohn  Stahl M.D.   On: 04/15/2018 23:27    Assessment & Plan:   Donald Chavez was seen today for cough.  Diagnoses and all orders for this visit:  Chest pressure- The description of the chest pressure is reassuring that this is not cardiac chest pain.  His normal EKG is also reassuring.  I think the pressure he is experiencing is caused by a COPD exacerbation. -     EKG 12-Lead  Cough  COPD with acute exacerbation (HCC)- I recommended that he treat this with a 5-day course of prednisone and a 3-day course of Zithromax.  I have also asked him to start using a LAMA/LABA inhaler and a SABA inhaler as needed. -     predniSONE (DELTASONE) 50 MG tablet; Take 1 tablet (50 mg total) by mouth daily with breakfast. -     azithromycin (ZITHROMAX) 500 MG tablet; Take 1 tablet (500 mg total) by mouth daily for 3 days. -     umeclidinium-vilanterol (ANORO ELLIPTA) 62.5-25 MCG/INH AEPB; Inhale 1 puff into the lungs daily. -     albuterol (VENTOLIN HFA) 108 (90 Base) MCG/ACT inhaler; Inhale 2 puffs into the lungs every 6 (six) hours as needed for wheezing or shortness of breath.   I am having Donald Chavez start on predniSONE, azithromycin, umeclidinium-vilanterol, and albuterol.  Meds ordered this encounter  Medications  . predniSONE (DELTASONE) 50 MG tablet    Sig: Take 1 tablet (50 mg total) by mouth daily with breakfast.    Dispense:  5 tablet    Refill:  0  . azithromycin (ZITHROMAX) 500 MG tablet     Sig: Take 1 tablet (500 mg total) by mouth daily for 3 days.    Dispense:  3 tablet    Refill:  0  . umeclidinium-vilanterol (ANORO ELLIPTA) 62.5-25 MCG/INH AEPB    Sig: Inhale 1 puff into the lungs daily.    Dispense:  90 each    Refill:  1  . albuterol (VENTOLIN HFA) 108 (90 Base) MCG/ACT inhaler    Sig: Inhale 2 puffs into the lungs every 6 (six) hours as needed for wheezing or shortness of breath.    Dispense:  3 Inhaler    Refill:  2     Follow-up: Return in about 3 months (around 12/24/2018).  Sanda Lingerhomas Addilynn Mowrer, MD

## 2018-09-29 ENCOUNTER — Telehealth: Payer: Self-pay

## 2018-09-29 NOTE — Telephone Encounter (Signed)
Carney Bern called about the price of the anoro inhaler. She is wanting to know if there is a cheaper version.

## 2018-10-01 ENCOUNTER — Telehealth: Payer: Self-pay | Admitting: *Deleted

## 2018-10-01 NOTE — Telephone Encounter (Signed)
Pt called to speak with [facility] regarding inhaler. Would like a callback. Please advise.

## 2018-10-01 NOTE — Telephone Encounter (Signed)
See 10/01/18 tele encounter. Closing this note.

## 2018-10-01 NOTE — Telephone Encounter (Signed)
I called pt- I informed him PCP is out of office until 10/05/18. I put 1 Anoro Ellipta sample upfront for pt. He wants to know if there is a cheaper alternative he can try once he uses the sample. Please advise.

## 2018-10-07 NOTE — Telephone Encounter (Signed)
Spoke to pt spouse. She stated that she has not heard anything since her original request regarding an alternative to the Anoro. Please advise if there is a cheaper alternative.

## 2018-10-07 NOTE — Telephone Encounter (Signed)
I gave him a co-pay card that will lower the price to $10 per month Have they used the co-pay card?  TJ

## 2018-10-08 NOTE — Telephone Encounter (Signed)
Spoke to pt and he does not have the copay card. I will send a card via fax to the pharmacy.

## 2018-10-09 NOTE — Telephone Encounter (Signed)
Do you have any more Anoro Copay cards?

## 2018-10-09 NOTE — Telephone Encounter (Signed)
On your desk  TJ

## 2019-02-08 ENCOUNTER — Other Ambulatory Visit: Payer: Self-pay | Admitting: Internal Medicine

## 2019-02-08 DIAGNOSIS — J441 Chronic obstructive pulmonary disease with (acute) exacerbation: Secondary | ICD-10-CM

## 2019-02-11 ENCOUNTER — Other Ambulatory Visit: Payer: Self-pay

## 2019-02-11 ENCOUNTER — Ambulatory Visit (INDEPENDENT_AMBULATORY_CARE_PROVIDER_SITE_OTHER): Payer: PRIVATE HEALTH INSURANCE

## 2019-02-11 DIAGNOSIS — Z23 Encounter for immunization: Secondary | ICD-10-CM | POA: Diagnosis not present

## 2019-04-20 ENCOUNTER — Encounter: Payer: PRIVATE HEALTH INSURANCE | Admitting: Internal Medicine

## 2019-04-21 ENCOUNTER — Ambulatory Visit (INDEPENDENT_AMBULATORY_CARE_PROVIDER_SITE_OTHER): Payer: PRIVATE HEALTH INSURANCE | Admitting: Internal Medicine

## 2019-04-21 ENCOUNTER — Encounter: Payer: Self-pay | Admitting: Internal Medicine

## 2019-04-21 ENCOUNTER — Other Ambulatory Visit: Payer: Self-pay

## 2019-04-21 ENCOUNTER — Other Ambulatory Visit (INDEPENDENT_AMBULATORY_CARE_PROVIDER_SITE_OTHER): Payer: PRIVATE HEALTH INSURANCE

## 2019-04-21 VITALS — BP 128/76 | HR 67 | Temp 98.2°F | Resp 16 | Ht 77.0 in | Wt 199.0 lb

## 2019-04-21 DIAGNOSIS — R739 Hyperglycemia, unspecified: Secondary | ICD-10-CM | POA: Diagnosis not present

## 2019-04-21 DIAGNOSIS — Z0000 Encounter for general adult medical examination without abnormal findings: Secondary | ICD-10-CM

## 2019-04-21 DIAGNOSIS — Z720 Tobacco use: Secondary | ICD-10-CM

## 2019-04-21 DIAGNOSIS — Z1211 Encounter for screening for malignant neoplasm of colon: Secondary | ICD-10-CM

## 2019-04-21 DIAGNOSIS — J441 Chronic obstructive pulmonary disease with (acute) exacerbation: Secondary | ICD-10-CM | POA: Diagnosis not present

## 2019-04-21 LAB — BASIC METABOLIC PANEL
BUN: 18 mg/dL (ref 6–23)
CO2: 28 mEq/L (ref 19–32)
Calcium: 9.4 mg/dL (ref 8.4–10.5)
Chloride: 104 mEq/L (ref 96–112)
Creatinine, Ser: 1.03 mg/dL (ref 0.40–1.50)
GFR: 75.2 mL/min (ref >60.00)
Glucose, Bld: 106 mg/dL — ABNORMAL HIGH (ref 70–99)
Potassium: 4.1 mEq/L (ref 3.5–5.1)
Sodium: 139 mEq/L (ref 135–145)

## 2019-04-21 LAB — LIPID PANEL
Cholesterol: 154 mg/dL (ref 0–200)
HDL: 40.5 mg/dL (ref >39.00)
LDL Cholesterol: 89 mg/dL (ref 0–99)
NonHDL: 113.73
Total CHOL/HDL Ratio: 4
Triglycerides: 125 mg/dL (ref 0.0–149.0)
VLDL: 25 mg/dL (ref 0.0–40.0)

## 2019-04-21 LAB — PSA: PSA: 0.67 ng/mL (ref 0.10–4.00)

## 2019-04-21 LAB — HEMOGLOBIN A1C: Hgb A1c MFr Bld: 5.5 % (ref 4.6–6.5)

## 2019-04-21 MED ORDER — ANORO ELLIPTA 62.5-25 MCG/INH IN AEPB: INHALATION_SPRAY | RESPIRATORY_TRACT | 1 refills | Status: DC

## 2019-04-21 NOTE — Patient Instructions (Signed)

## 2019-04-21 NOTE — Progress Notes (Signed)
Subjective:  Patient ID: Donald Chavez, male    DOB: 1965-11-05  Age: 53 y.o. MRN: [id number]  CC: COPD and Annual Exam   This visit occurred during the SARS-CoV-2 public health emergency.  Safety protocols were in place, including screening questions prior to the visit, additional usage of staff PPE, and extensive cleaning of exam room while observing appropriate contact time as indicated for disinfecting solutions.   HPI Donald Chavez presents for a CPX.  He tells me his pulmonary symptoms are well controlled with the combination of a LABA/LAMA.  He denies any recent episodes of chest pain, shortness of breath, cough, hemoptysis, or wheezing.  Outpatient Medications Prior to Visit  Medication Sig Dispense Refill  . albuterol (VENTOLIN HFA) 108 (90 Base) MCG/ACT inhaler Inhale 2 puffs into the lungs every 6 (six) hours as needed for wheezing or shortness of breath. 3 Inhaler 2  . ANORO ELLIPTA 62.5-25 MCG/INH AEPB INHALE ONE PUFF INTO THE LUNGS DAILY. 120 each 1  . predniSONE (DELTASONE) 50 MG tablet Take 1 tablet (50 mg total) by mouth daily with breakfast. 5 tablet 0   No facility-administered medications prior to visit.     ROS Review of Systems  Constitutional: Negative.  Negative for diaphoresis and fatigue.  HENT: Negative.   Eyes: Negative for visual disturbance.  Respiratory: Negative for cough, chest tightness, shortness of breath and wheezing.   Cardiovascular: Negative for chest pain, palpitations and leg swelling.  Gastrointestinal: Negative for abdominal pain, constipation, diarrhea, nausea and vomiting.  Endocrine: Negative.   Genitourinary: Negative.  Negative for difficulty urinating, hematuria, scrotal swelling and testicular pain.  Musculoskeletal: Negative.  Negative for arthralgias and myalgias.  Skin: Negative.   Neurological: Negative.  Negative for dizziness, weakness and headaches.  Hematological: Negative for adenopathy. Does not bruise/bleed easily.   Psychiatric/Behavioral: Negative.     Objective:  BP 128/76 (BP Location: Left Arm, Patient Position: Sitting, Cuff Size: Normal)   Pulse 67   Temp 98.2 F (36.8 C) (Oral)   Resp 16   Ht 6\' 5"  (1.956 m)   Wt 199 lb (90.3 kg)   SpO2 97%   BMI 23.60 kg/m   BP Readings from Last 3 Encounters:  04/21/19 128/76  09/23/18 (!) 144/86  06/25/18 120/70    Wt Readings from Last 3 Encounters:  04/21/19 199 lb (90.3 kg)  09/23/18 200 lb 12 oz (91.1 kg)  06/25/18 203 lb (92.1 kg)    Physical Exam Vitals signs reviewed.  Constitutional:      Appearance: Normal appearance.  HENT:     Nose: Nose normal.     Mouth/Throat:     Mouth: Mucous membranes are moist.  Eyes:     General: No scleral icterus.    Conjunctiva/sclera: Conjunctivae normal.  Neck:     Musculoskeletal: Neck supple.  Cardiovascular:     Rate and Rhythm: Normal rate and regular rhythm.     Heart sounds: No murmur.  Pulmonary:     Effort: Pulmonary effort is normal.     Breath sounds: No stridor. Examination of the right-lower field reveals rhonchi. Examination of the left-lower field reveals rhonchi. Rhonchi present. No decreased breath sounds, wheezing or rales.  Abdominal:     General: Abdomen is flat. Bowel sounds are normal. There is no distension.     Palpations: Abdomen is soft. There is no hepatomegaly or splenomegaly.     Tenderness: There is no abdominal tenderness.     Hernia: There is no  hernia in the left inguinal area or right inguinal area.  Genitourinary:    Pubic Area: No rash.      Penis: Normal. No discharge, swelling or lesions.      Scrotum/Testes: Normal.        Right: Mass or tenderness not present.        Left: Mass or tenderness not present.     Epididymis:     Right: Normal.     Left: Normal.     Prostate: Normal. Not enlarged, not tender and no nodules present.     Rectum: Normal. Guaiac result negative. No mass, tenderness, anal fissure, external hemorrhoid or internal  hemorrhoid. Normal anal tone.  Lymphadenopathy:     Cervical: No cervical adenopathy.     Lower Body: No right inguinal adenopathy. No left inguinal adenopathy.  Neurological:     Mental Status: He is alert.     Lab Results  Component Value Date   WBC 9.8 04/15/2018   HGB 15.4 04/15/2018   HCT 45.4 04/15/2018   PLT 311.0 04/15/2018   GLUCOSE 106 (H) 04/21/2019   CHOL 154 04/21/2019   TRIG 125.0 04/21/2019   HDL 40.50 04/21/2019   LDLDIRECT 101.0 04/15/2018   LDLCALC 89 04/21/2019   ALT 17 04/15/2018   AST 15 04/15/2018   NA 139 04/21/2019   K 4.1 04/21/2019   CL 104 04/21/2019   CREATININE 1.03 04/21/2019   BUN 18 04/21/2019   CO2 28 04/21/2019   PSA 0.67 04/21/2019   HGBA1C 5.5 04/21/2019    Dg Chest 2 View  Result Date: 04/15/2018 CLINICAL DATA:  Chronic cough.  Wheezing.  Smoker. EXAM: CHEST - 2 VIEW COMPARISON:  None. FINDINGS: The heart size and mediastinal contours are within normal limits. Both lungs are clear. Pulmonary hyperinflation noted, consistent with COPD. The visualized skeletal structures are unremarkable. IMPRESSION: COPD.  No active cardiopulmonary disease. Electronically Signed   By: Myles Rosenthal M.D.   On: 04/15/2018 23:27    Assessment & Plan:   Donald Chavez was seen today for copd and annual exam.  Diagnoses and all orders for this visit:  Hyperglycemia- his A1c is normal. -     Basic metabolic panel; Future -     Hemoglobin A1c; Future  Routine general medical examination at a health care facility- Exam completed, labs reviewed - He does not have an elevated ASCVD risk score so I did not recommend a statin for CV risk reduction, vaccines reviewed, Cologuard ordered to screen for colon cancer/polyps, patient education was given. -     Lipid panel; Future -     PSA; Future -     HIV antibody (Reflex); Future  Colon cancer screening -     Cologuard  COPD with acute exacerbation (HCC) - His symptoms are well controlled with the current inhaler.  I  encouraged him to stop smoking. -     umeclidinium-vilanterol (ANORO ELLIPTA) 62.5-25 MCG/INH AEPB; INHALE ONE PUFF INTO THE LUNGS DAILY.  Tobacco abuse -     Ambulatory Referral for Lung Cancer Scre   I have discontinued Dymir Neeson. Deruiter's predniSONE. I have also changed his Anoro Ellipta. Additionally, I am having him maintain his albuterol.  Meds ordered this encounter  Medications  . umeclidinium-vilanterol (ANORO ELLIPTA) 62.5-25 MCG/INH AEPB    Sig: INHALE ONE PUFF INTO THE LUNGS DAILY.    Dispense:  120 each    Refill:  1     Follow-up: Return in about 6 months (around  10/20/2019).  Sanda Lingerhomas Dougles Kimmey, MD

## 2019-04-22 ENCOUNTER — Encounter: Payer: Self-pay | Admitting: Internal Medicine

## 2019-04-22 LAB — HIV ANTIBODY (ROUTINE TESTING W REFLEX): HIV 1&2 Ab, 4th Generation: NONREACTIVE

## 2019-11-16 ENCOUNTER — Other Ambulatory Visit: Payer: Self-pay

## 2019-11-16 ENCOUNTER — Telehealth: Payer: Self-pay | Admitting: Internal Medicine

## 2019-11-16 ENCOUNTER — Ambulatory Visit (INDEPENDENT_AMBULATORY_CARE_PROVIDER_SITE_OTHER): Payer: PRIVATE HEALTH INSURANCE | Admitting: Internal Medicine

## 2019-11-16 ENCOUNTER — Encounter: Payer: Self-pay | Admitting: Internal Medicine

## 2019-11-16 VITALS — BP 120/78 | HR 67 | Temp 98.1°F | Ht 77.0 in | Wt 202.0 lb

## 2019-11-16 DIAGNOSIS — L02214 Cutaneous abscess of groin: Secondary | ICD-10-CM | POA: Insufficient documentation

## 2019-11-16 MED ORDER — SIVEXTRO 200 MG PO TABS: 1.0000 | ORAL_TABLET | Freq: Every day | ORAL | 0 refills | Status: DC

## 2019-11-16 MED ORDER — DOXYCYCLINE HYCLATE 100 MG PO TABS: 100.0000 mg | ORAL_TABLET | Freq: Two times a day (BID) | ORAL | 0 refills | Status: DC

## 2019-11-16 NOTE — Progress Notes (Signed)
Subjective:  Patient ID: Donald Chavez, male    DOB: 1965/08/12  Age: 54 y.o. MRN: [id number]  CC: Groin Swelling  This visit occurred during the SARS-CoV-2 public health emergency.  Safety protocols were in place, including screening questions prior to the visit, additional usage of staff PPE, and extensive cleaning of exam room while observing appropriate contact time as indicated for disinfecting solutions.    HPI Donald Chavez presents for concerns about his right groin.  He has an area of pain, redness, and swelling.  He has been treating it with multiple topical agents to no avail.  Outpatient Medications Prior to Visit  Medication Sig Dispense Refill  . albuterol (VENTOLIN HFA) 108 (90 Base) MCG/ACT inhaler Inhale 2 puffs into the lungs every 6 (six) hours as needed for wheezing or shortness of breath. 3 Inhaler 2  . umeclidinium-vilanterol (ANORO ELLIPTA) 62.5-25 MCG/INH AEPB INHALE ONE PUFF INTO THE LUNGS DAILY. 120 each 1   No facility-administered medications prior to visit.    ROS Review of Systems  Constitutional: Negative for chills, fatigue and fever.  HENT: Negative.   Eyes: Negative.   Respiratory: Negative for cough, chest tightness and wheezing.   Cardiovascular: Negative for chest pain, palpitations and leg swelling.  Gastrointestinal: Negative for abdominal pain, diarrhea and nausea.  Endocrine: Negative.   Genitourinary: Negative.  Negative for difficulty urinating, hematuria, scrotal swelling, testicular pain and urgency.  Skin: Positive for color change.  Neurological: Negative.   Hematological: Negative for adenopathy. Does not bruise/bleed easily.  Psychiatric/Behavioral: Negative.     Objective:  BP 120/78 (BP Location: Left Arm, Patient Position: Sitting, Cuff Size: Large)   Pulse 67   Temp 98.1 F (36.7 C) (Oral)   Ht 6\' 5"  (1.956 m)   Wt 202 lb (91.6 kg)   SpO2 96%   BMI 23.95 kg/m   BP Readings from Last 3 Encounters:  11/16/19  120/78  04/21/19 128/76  09/23/18 (!) 144/86    Wt Readings from Last 3 Encounters:  11/16/19 202 lb (91.6 kg)  04/21/19 199 lb (90.3 kg)  09/23/18 200 lb 12 oz (91.1 kg)    Physical Exam Vitals reviewed. Exam conducted with a chaperone present 11/23/18 and Cherie Dark).  Constitutional:      Appearance: He is not ill-appearing.  Genitourinary:    Pubic Area: No rash.      Penis: Normal.        Lab Results  Component Value Date   WBC 9.8 04/15/2018   HGB 15.4 04/15/2018   HCT 45.4 04/15/2018   PLT 311.0 04/15/2018   GLUCOSE 106 (H) 04/21/2019   CHOL 154 04/21/2019   TRIG 125.0 04/21/2019   HDL 40.50 04/21/2019   LDLDIRECT 101.0 04/15/2018   LDLCALC 89 04/21/2019   ALT 17 04/15/2018   AST 15 04/15/2018   NA 139 04/21/2019   K 4.1 04/21/2019   CL 104 04/21/2019   CREATININE 1.03 04/21/2019   BUN 18 04/21/2019   CO2 28 04/21/2019   PSA 0.67 04/21/2019   HGBA1C 5.5 04/21/2019    DG Chest 2 View  Result Date: 04/15/2018 CLINICAL DATA:  Chronic cough.  Wheezing.  Smoker. EXAM: CHEST - 2 VIEW COMPARISON:  None. FINDINGS: The heart size and mediastinal contours are within normal limits. Both lungs are clear. Pulmonary hyperinflation noted, consistent with COPD. The visualized skeletal structures are unremarkable. IMPRESSION: COPD.  No active cardiopulmonary disease. Electronically Signed   By: 04/17/2018.D.  On: 04/15/2018 23:27   After informed verbal consent was obtained. Using Betadine for cleansing and 2% Lidocaine with epinephrine for anesthetic (2 cc's used) . With sterile technique a 4 mm punch incision was made to open the area. A copious amount of seropurulent exudate was removed and sent for culture.  The cavity was explored and several loculations were disrupted.  There was no deep tracking or deep involvement.  Culture has been sent.  The cavity was cleaned with hydrogen peroxide and iodoform packing was placed.  Hemostasis was obtained. The  procedure was well tolerated without complications.  Assessment & Plan:   [name] was seen today for groin swelling.  Diagnoses and all orders for this visit:  Abscess of groin, right- I am concerned about bacterial causes such as strep and possibly gram-positive's and anaerobes.  I had asked him to take a 6-day course of tedizolid but he tells me is too expensive.  He tells me he does not tolerate amoxicillin so Augmentin is not an option.  Will treat with doxycycline pending the results of the culture and sensitivity. -     WOUND CULTURE; Future -     Discontinue: Tedizolid Phosphate (Chavez) 200 MG TABS; Take 1 tablet by mouth daily for 6 days. -     doxycycline (VIBRA-TABS) 100 MG tablet; Take 1 tablet (100 mg total) by mouth 2 (two) times daily. -     WOUND CULTURE   I have discontinued Donald Chavez. Donald Chavez. I am also having him start on doxycycline. Additionally, I am having him maintain his albuterol and Anoro Ellipta.  Meds ordered this encounter  Medications  . DISCONTD: Tedizolid Phosphate (Chavez) 200 MG TABS    Sig: Take 1 tablet by mouth daily for 6 days.    Dispense:  6 tablet    Refill:  0  . doxycycline (VIBRA-TABS) 100 MG tablet    Sig: Take 1 tablet (100 mg total) by mouth 2 (two) times daily.    Dispense:  20 tablet    Refill:  0     Follow-up: Return in about 2 days (around 11/18/2019).  Sanda Linger, MD

## 2019-11-16 NOTE — Patient Instructions (Signed)
Incision and Drainage Incision and drainage is a surgical procedure to open and drain a fluid-filled sac. The sac may be filled with pus, mucus, or blood. Examples of fluid-filled sacs that may need surgical drainage include cysts, skin infections (abscesses), and red lumps that develop from a ruptured cyst or a small abscess (boils). You may need this procedure if the affected area is large, painful, infected, or not healing well. Tell a health care provider about:  Any allergies you have.  All medicines you are taking, including vitamins, herbs, eye drops, creams, and over-the-counter medicines.  Any problems you or family members have had with anesthetic medicines.  Any blood disorders you have or have had.  Any surgeries you have had.  Any medical conditions you have or have had.  Whether you are pregnant or may be pregnant. What are the risks? Generally, this is a safe procedure. However, problems may occur, including:  Infection.  Bleeding.  Allergic reactions to medicines.  Scarring.  The cyst or abscess returns.  Damage to nerves or vessels. What happens before the procedure? Medicine Ask your health care provider about:  Changing or stopping your regular medicines. This is especially important if you are taking diabetes medicines or blood thinners.  Taking medicines such as aspirin and ibuprofen. These medicines can thin your blood. Do not take these medicines unless your health care provider tells you to take them.  Taking over-the-counter medicines, vitamins, herbs, and supplements. Tests You may have an exam or testing. These may include:  Ultrasound or other imaging tests to see how large or deep the fluid-filled sac is.  Blood tests to check for infection. General instructions  Follow instructions from your health care provider about eating or drinking restrictions.  Plan to have someone take you home from the hospital or clinic.  Ask your health care  provider whether a responsible adult should care for you for at least 24 hours after you leave the hospital or clinic. This is important.  You may get a tetanus shot.  Ask your health care provider: ? How your surgery site will be marked or identified. ? What steps will be taken to help prevent infection. These may include:  Removing hair at the surgery site.  Washing skin with a germ-killing soap.  Receiving antibiotic medicine. What happens during the procedure?   An IV may be inserted into one of your veins.  You will be given one or more of the following: ? A medicine to help you relax (sedative). ? A medicine to numb the area (local anesthetic). ? A medicine to make you fall asleep (general anesthetic).  An incision will be made in the top of the fluid-filled sac.  Pus, blood, and mucus will be squeezed out, and a syringe or tube (drain) may be used to empty more fluid from the sac.  Your health care provider will do one of the following. He or she may: ? Leave the drain in place for several weeks to drain more fluid. ? Stitch open the edges of the incision to make a long-term opening for drainage (marsupialization).  The inside of the sac may be washed out (irrigated) with a sterile solution and packed with gauze before it is covered with a bandage (dressing).  Your health care provider do a culture test of the drainage fluid. The procedure may vary among health care providers and hospitals. What happens after the procedure?  Your blood pressure, heart rate, breathing rate, and blood oxygen level   will be monitored often until you leave the hospital or clinic.  Do not drive for 24 hours if you were given a sedative during your procedure. Summary  Incision and drainage is a surgical procedure to open and drain a fluid-filled sac. The sac may be filled with pus, mucus, or blood.  Before the procedure, you may be given antibiotic medicine to treat or help prevent  infection.  During the procedure, an incision will be made in the top of the fluid-filled sac. Pus, blood, and mucus is squeezed out, and a syringe or tube (drain) may be used to empty more fluid from the sac.  The inside of the sac may be washed out (irrigated) with a sterile solution and packed with gauze before it is covered with a bandage (dressing). This information is not intended to replace advice given to you by your health care provider. Make sure you discuss any questions you have with your health care provider. Document Revised: 04/06/2018 Document Reviewed: 04/06/2018 Elsevier Patient Education  2020 [employer]  

## 2019-11-16 NOTE — Telephone Encounter (Signed)
New message:    Pt is calling and states that the medication that the Dr. Yetta Barre just sent to the pharmacy for him. He states the medication is $2700 for 6 pills and he has no insurance.

## 2019-11-17 NOTE — Telephone Encounter (Signed)
Reviewed medication list - doxycycline was sent in.   Pt contacted and informed of same. Pt has been added to schedule for tomorrow.

## 2019-11-17 NOTE — Telephone Encounter (Signed)
    Patient following up on medication alternative and patient also states he is to come to office tomorrow at 11 Please advise

## 2019-11-18 ENCOUNTER — Ambulatory Visit (INDEPENDENT_AMBULATORY_CARE_PROVIDER_SITE_OTHER): Payer: PRIVATE HEALTH INSURANCE | Admitting: Internal Medicine

## 2019-11-18 ENCOUNTER — Encounter: Payer: Self-pay | Admitting: Internal Medicine

## 2019-11-18 ENCOUNTER — Other Ambulatory Visit: Payer: Self-pay

## 2019-11-18 VITALS — BP 122/78 | HR 67 | Temp 98.5°F | Ht 77.0 in | Wt 201.2 lb

## 2019-11-18 DIAGNOSIS — L02214 Cutaneous abscess of groin: Secondary | ICD-10-CM

## 2019-11-18 NOTE — Patient Instructions (Signed)
Skin Abscess  A skin abscess is an infected area on or under your skin that contains a collection of pus and other material. An abscess may also be called a furuncle, carbuncle, or boil. An abscess can occur in or on almost any part of your body. Some abscesses break open (rupture) on their own. Most continue to get worse unless they are treated. The infection can spread deeper into the body and eventually into your blood, which can make you feel ill. Treatment usually involves draining the abscess. What are the causes? An abscess occurs when germs, like bacteria, pass through your skin and cause an infection. This may be caused by:  A scrape or cut on your skin.  A puncture wound through your skin, including a needle injection or insect bite.  Blocked oil or sweat glands.  Blocked and infected hair follicles.  A cyst that forms beneath your skin (sebaceous cyst) and becomes infected. What increases the risk? This condition is more likely to develop in people who:  Have a weak body defense system (immune system).  Have diabetes.  Have dry and irritated skin.  Get frequent injections or use illegal IV drugs.  Have a foreign body in a wound, such as a splinter.  Have problems with their lymph system or veins. What are the signs or symptoms? Symptoms of this condition include:  A painful, firm bump under the skin.  A bump with pus at the top. This may break through the skin and drain. Other symptoms include:  Redness surrounding the abscess site.  Warmth.  Swelling of the lymph nodes (glands) near the abscess.  Tenderness.  A sore on the skin. How is this diagnosed? This condition may be diagnosed based on:  A physical exam.  Your medical history.  A sample of pus. This may be used to find out what is causing the infection.  Blood tests.  Imaging tests, such as an ultrasound, CT scan, or MRI. How is this treated? A small abscess that drains on its own may  not need treatment. Treatment for larger abscesses may include:  Moist heat or heat pack applied to the area several times a day.  A procedure to drain the abscess (incision and drainage).  Antibiotic medicines. For a severe abscess, you may first get antibiotics through an IV and then change to antibiotics by mouth. Follow these instructions at home: Medicines   Take over-the-counter and prescription medicines only as told by your health care provider.  If you were prescribed an antibiotic medicine, take it as told by your health care provider. Do not stop taking the antibiotic even if you start to feel better. Abscess care   If you have an abscess that has not drained, apply heat to the affected area. Use the heat source that your health care provider recommends, such as a moist heat pack or a heating pad. ? Place a towel between your skin and the heat source. ? Leave the heat on for 20-30 minutes. ? Remove the heat if your skin turns bright red. This is especially important if you are unable to feel pain, heat, or cold. You may have a greater risk of getting burned.  Follow instructions from your health care provider about how to take care of your abscess. Make sure you: ? Cover the abscess with a bandage (dressing). ? Change your dressing or gauze as told by your health care provider. ? Wash your hands with soap and water before you change the   dressing or gauze. If soap and water are not available, use hand sanitizer.  Check your abscess every day for signs of a worsening infection. Check for: ? More redness, swelling, or pain. ? More fluid or blood. ? Warmth. ? More pus or a bad smell. General instructions  To avoid spreading the infection: ? Do not share personal care items, towels, or hot tubs with others. ? Avoid making skin contact with other people.  Keep all follow-up visits as told by your health care provider. This is important. Contact a health care provider if  you have:  More redness, swelling, or pain around your abscess.  More fluid or blood coming from your abscess.  Warm skin around your abscess.  More pus or a bad smell coming from your abscess.  A fever.  Muscle aches.  Chills or a general ill feeling. Get help right away if you:  Have severe pain.  See red streaks on your skin spreading away from the abscess. Summary  A skin abscess is an infected area on or under your skin that contains a collection of pus and other material.  A small abscess that drains on its own may not need treatment.  Treatment for larger abscesses may include having a procedure to drain the abscess and taking an antibiotic. This information is not intended to replace advice given to you by your health care provider. Make sure you discuss any questions you have with your health care provider. Document Revised: 08/27/2018 Document Reviewed: 06/19/2017 Elsevier Patient Education  2020 [employer] Inc.  

## 2019-11-18 NOTE — Progress Notes (Signed)
Subjective:  Patient ID: MATTI MINNEY, male    DOB: 08-09-1965  Age: 54 y.o. MRN: [id number]  CC: Wound Check  This visit occurred during the SARS-CoV-2 public health emergency.  Safety protocols were in place, including screening questions prior to the visit, additional usage of staff PPE, and extensive cleaning of exam room while observing appropriate contact time as indicated for disinfecting solutions.    HPI Thanos Cousineau Mcree presents for f/up - He is 2 days status post incision and drainage of an abscess in his right groin.  He tells me he is taking doxycycline and the area feels much better.  The culture was positive for polymicrobial skin flora.  Outpatient Medications Prior to Visit  Medication Sig Dispense Refill  . albuterol (VENTOLIN HFA) 108 (90 Base) MCG/ACT inhaler Inhale 2 puffs into the lungs every 6 (six) hours as needed for wheezing or shortness of breath. 3 Inhaler 2  . doxycycline (VIBRA-TABS) 100 MG tablet Take 1 tablet (100 mg total) by mouth 2 (two) times daily. 20 tablet 0  . umeclidinium-vilanterol (ANORO ELLIPTA) 62.5-25 MCG/INH AEPB INHALE ONE PUFF INTO THE LUNGS DAILY. 120 each 1   No facility-administered medications prior to visit.    ROS Review of Systems  Constitutional: Negative for chills, fatigue and fever.  HENT: Negative.   Eyes: Negative for visual disturbance.  Respiratory: Negative for cough, chest tightness, shortness of breath and wheezing.   Cardiovascular: Negative for chest pain, palpitations and leg swelling.  Gastrointestinal: Negative for abdominal pain, constipation, diarrhea and nausea.  Endocrine: Negative.   Genitourinary: Negative.  Negative for difficulty urinating.  Musculoskeletal: Negative for arthralgias.  Skin: Negative.  Negative for color change.  Neurological: Negative.   Hematological: Negative for adenopathy. Does not bruise/bleed easily.  Psychiatric/Behavioral: Negative.     Objective:  BP 122/78 (BP Location:  Left Arm, Patient Position: Sitting, Cuff Size: Normal)   Pulse 67   Temp 98.5 F (36.9 C) (Oral)   Ht 6\' 5"  (1.956 m)   Wt 201 lb 4 oz (91.3 kg)   SpO2 95%   BMI 23.86 kg/m   BP Readings from Last 3 Encounters:  11/18/19 122/78  11/16/19 120/78  04/21/19 128/76    Wt Readings from Last 3 Encounters:  11/18/19 201 lb 4 oz (91.3 kg)  11/16/19 202 lb (91.6 kg)  04/21/19 199 lb (90.3 kg)    Physical Exam Vitals reviewed. Exam conducted with a chaperone present 14/02/20).  Genitourinary:    Pubic Area: No rash.        Lab Results  Component Value Date   WBC 9.8 04/15/2018   HGB 15.4 04/15/2018   HCT 45.4 04/15/2018   PLT 311.0 04/15/2018   GLUCOSE 106 (H) 04/21/2019   CHOL 154 04/21/2019   TRIG 125.0 04/21/2019   HDL 40.50 04/21/2019   LDLDIRECT 101.0 04/15/2018   LDLCALC 89 04/21/2019   ALT 17 04/15/2018   AST 15 04/15/2018   NA 139 04/21/2019   K 4.1 04/21/2019   CL 104 04/21/2019   CREATININE 1.03 04/21/2019   BUN 18 04/21/2019   CO2 28 04/21/2019   PSA 0.67 04/21/2019   HGBA1C 5.5 04/21/2019    DG Chest 2 View  Result Date: 04/15/2018 CLINICAL DATA:  Chronic cough.  Wheezing.  Smoker. EXAM: CHEST - 2 VIEW COMPARISON:  None. FINDINGS: The heart size and mediastinal contours are within normal limits. Both lungs are clear. Pulmonary hyperinflation noted, consistent with COPD. The visualized skeletal structures  are unremarkable. IMPRESSION: COPD.  No active cardiopulmonary disease. Electronically Signed   By: Myles Rosenthal M.D.   On: 04/15/2018 23:27    Assessment & Plan:   Delonte was seen today for wound check.  Diagnoses and all orders for this visit:  Abscess of groin, right- This was a polymicrobial infection.  Incision and drainage is the best treatment option.  I do not think it should be repacked with iodoform today.  I expect this to resolve and heal from the inside out over the next few weeks.  He will let me know if he develops any new or  worsening symptoms.   I am having Korry Dalgleish. Humber maintain his albuterol, Anoro Ellipta, and doxycycline.  No orders of the defined types were placed in this encounter.    Follow-up: Return in about 1 week (around 11/25/2019).  Sanda Linger, MD

## 2019-11-19 LAB — WOUND CULTURE

## 2020-01-25 ENCOUNTER — Other Ambulatory Visit: Payer: Self-pay | Admitting: Internal Medicine

## 2020-01-25 DIAGNOSIS — J441 Chronic obstructive pulmonary disease with (acute) exacerbation: Secondary | ICD-10-CM

## 2020-03-28 ENCOUNTER — Telehealth: Payer: Self-pay

## 2020-03-28 NOTE — Telephone Encounter (Signed)
Cologuard will expire soon.  Pt due for annual appt & will reorder at that time.

## 2020-12-14 ENCOUNTER — Ambulatory Visit (INDEPENDENT_AMBULATORY_CARE_PROVIDER_SITE_OTHER): Payer: No Typology Code available for payment source | Admitting: Internal Medicine

## 2020-12-14 ENCOUNTER — Encounter: Payer: Self-pay | Admitting: Internal Medicine

## 2020-12-14 ENCOUNTER — Other Ambulatory Visit: Payer: Self-pay

## 2020-12-14 VITALS — BP 124/80 | HR 58 | Temp 98.4°F | Ht 77.0 in | Wt 204.8 lb

## 2020-12-14 DIAGNOSIS — Z23 Encounter for immunization: Secondary | ICD-10-CM | POA: Diagnosis not present

## 2020-12-14 DIAGNOSIS — J441 Chronic obstructive pulmonary disease with (acute) exacerbation: Secondary | ICD-10-CM

## 2020-12-14 DIAGNOSIS — J410 Simple chronic bronchitis: Secondary | ICD-10-CM

## 2020-12-14 DIAGNOSIS — Z720 Tobacco use: Secondary | ICD-10-CM

## 2020-12-14 DIAGNOSIS — Z0000 Encounter for general adult medical examination without abnormal findings: Secondary | ICD-10-CM | POA: Diagnosis not present

## 2020-12-14 DIAGNOSIS — N5201 Erectile dysfunction due to arterial insufficiency: Secondary | ICD-10-CM | POA: Diagnosis not present

## 2020-12-14 DIAGNOSIS — Z1211 Encounter for screening for malignant neoplasm of colon: Secondary | ICD-10-CM

## 2020-12-14 DIAGNOSIS — R739 Hyperglycemia, unspecified: Secondary | ICD-10-CM | POA: Diagnosis not present

## 2020-12-14 DIAGNOSIS — Z1159 Encounter for screening for other viral diseases: Secondary | ICD-10-CM | POA: Insufficient documentation

## 2020-12-14 LAB — BASIC METABOLIC PANEL
BUN: 15 mg/dL (ref 6–23)
CO2: 29 mEq/L (ref 19–32)
Calcium: 9.5 mg/dL (ref 8.4–10.5)
Chloride: 101 mEq/L (ref 96–112)
Creatinine, Ser: 0.99 mg/dL (ref 0.40–1.50)
GFR: 85.55 mL/min (ref >60.00)
Glucose, Bld: 109 mg/dL — ABNORMAL HIGH (ref 70–99)
Potassium: 4.1 mEq/L (ref 3.5–5.1)
Sodium: 139 mEq/L (ref 135–145)

## 2020-12-14 LAB — CBC WITH DIFFERENTIAL/PLATELET
Basophils Absolute: 0.1 10*3/uL (ref 0.0–0.1)
Basophils Relative: 1 % (ref 0.0–3.0)
Eosinophils Absolute: 0.1 10*3/uL (ref 0.0–0.7)
Eosinophils Relative: 1.6 % (ref 0.0–5.0)
HCT: 49 % (ref 39.0–52.0)
Hemoglobin: 16.6 g/dL (ref 13.0–17.0)
Lymphocytes Relative: 21.2 % (ref 12.0–46.0)
Lymphs Abs: 1.7 10*3/uL (ref 0.7–4.0)
MCHC: 33.8 g/dL (ref 30.0–36.0)
MCV: 91.5 fl (ref 78.0–100.0)
Monocytes Absolute: 0.8 10*3/uL (ref 0.1–1.0)
Monocytes Relative: 9.3 % (ref 3.0–12.0)
Neutro Abs: 5.4 10*3/uL (ref 1.4–7.7)
Neutrophils Relative %: 66.9 % (ref 43.0–77.0)
Platelets: 256 10*3/uL (ref 150.0–400.0)
RBC: 5.36 Mil/uL (ref 4.22–5.81)
RDW: 13.2 % (ref 11.5–15.5)
WBC: 8.1 10*3/uL (ref 4.0–10.5)

## 2020-12-14 LAB — HEPATIC FUNCTION PANEL
ALT: 14 U/L (ref 0–53)
AST: 13 U/L (ref 0–37)
Albumin: 4.4 g/dL (ref 3.5–5.2)
Alkaline Phosphatase: 66 U/L (ref 39–117)
Bilirubin, Direct: 0.1 mg/dL (ref 0.0–0.3)
Total Bilirubin: 0.9 mg/dL (ref 0.2–1.2)
Total Protein: 7.1 g/dL (ref 6.0–8.3)

## 2020-12-14 LAB — LIPID PANEL
Cholesterol: 174 mg/dL (ref 0–200)
HDL: 42.8 mg/dL (ref >39.00)
NonHDL: 130.84
Total CHOL/HDL Ratio: 4
Triglycerides: 242 mg/dL — ABNORMAL HIGH (ref 0.0–149.0)
VLDL: 48.4 mg/dL — ABNORMAL HIGH (ref 0.0–40.0)

## 2020-12-14 LAB — HEMOGLOBIN A1C: Hgb A1c MFr Bld: 5.7 % (ref 4.6–6.5)

## 2020-12-14 LAB — PSA: PSA: 0.49 ng/mL (ref 0.10–4.00)

## 2020-12-14 LAB — LDL CHOLESTEROL, DIRECT: Direct LDL: 94 mg/dL

## 2020-12-14 MED ORDER — ANORO ELLIPTA 62.5-25 MCG/INH IN AEPB: INHALATION_SPRAY | RESPIRATORY_TRACT | 1 refills | Status: DC

## 2020-12-14 MED ORDER — TADALAFIL 20 MG PO TABS: 10.0000 mg | ORAL_TABLET | ORAL | 3 refills | Status: AC

## 2020-12-14 NOTE — Progress Notes (Signed)
Dysuc   Subjective:  Patient ID: Donald Chavez, male    DOB: [birth year]  Age: 55 y.o. MRN: [id number]  CC: Annual Exam  This visit occurred during the SARS-CoV-2 public health emergency.  Safety protocols were in place, including screening questions prior to the visit, additional usage of staff PPE, and extensive cleaning of exam room while observing appropriate contact time as indicated for disinfecting solutions.    HPI Donald Chavez presents for a CPX and f/up -   He complains of erectile dysfunction and wants to try a medication to treat this.  He continues to smoke cigarettes and has a chronic nonproductive cough with wheezing and shortness of breath.  He denies chest pain, diaphoresis, or dyspnea on exertion.  Outpatient Medications Prior to Visit  Medication Sig Dispense Refill   albuterol (VENTOLIN HFA) 108 (90 Base) MCG/ACT inhaler INHALE 2 PUFFS INTO THE LUNGS EVERY 6 HOURS AS NEEDED FOR WHEEZING OR SHORTNESS OF BREATH. SHAKE WELL BEFORE EACH USE. 54 g 1   umeclidinium-vilanterol (ANORO ELLIPTA) 62.5-25 MCG/INH AEPB USE ONE PUFF INTO THE LUNGS DAILY 120 each 1   doxycycline (VIBRA-TABS) 100 MG tablet Take 1 tablet (100 mg total) by mouth 2 (two) times daily. 20 tablet 0   No facility-administered medications prior to visit.    ROS Review of Systems  Constitutional:  Negative for chills, diaphoresis, fatigue and fever.  HENT: Negative.  Negative for trouble swallowing.   Eyes: Negative.   Respiratory:  Positive for cough, shortness of breath and wheezing. Negative for chest tightness and stridor.   Cardiovascular:  Negative for chest pain, palpitations and leg swelling.  Gastrointestinal:  Negative for abdominal pain, diarrhea, nausea and vomiting.  Genitourinary: Negative.  Negative for difficulty urinating, scrotal swelling and testicular pain.       +ED  Musculoskeletal: Negative.  Negative for arthralgias and myalgias.  Skin: Negative.   Neurological: Negative.   Negative for dizziness, weakness and light-headedness.  Hematological:  Negative for adenopathy. Does not bruise/bleed easily.  Psychiatric/Behavioral: Negative.     Objective:  BP 124/80 (BP Location: Right Arm, Patient Position: Sitting, Cuff Size: Large)   Pulse (!) 58   Temp 98.4 F (36.9 C) (Oral)   Ht 6\' 5"  (1.956 m)   Wt 204 lb 12.8 oz (92.9 kg)   SpO2 96%   BMI 24.29 kg/m   BP Readings from Last 3 Encounters:  12/14/20 124/80  11/18/19 122/78  11/16/19 120/78    Wt Readings from Last 3 Encounters:  12/14/20 204 lb 12.8 oz (92.9 kg)  11/18/19 201 lb 4 oz (91.3 kg)  11/16/19 202 lb (91.6 kg)    Physical Exam Vitals reviewed.  HENT:     Nose: Nose normal.     Mouth/Throat:     Mouth: Mucous membranes are moist.  Eyes:     Conjunctiva/sclera: Conjunctivae normal.  Cardiovascular:     Rate and Rhythm: Normal rate and regular rhythm.     Heart sounds: No murmur heard. Pulmonary:     Effort: Pulmonary effort is normal.     Breath sounds: Examination of the right-middle field reveals rhonchi. Examination of the left-middle field reveals wheezing and rhonchi. Examination of the right-lower field reveals rhonchi. Examination of the left-lower field reveals wheezing and rhonchi. Wheezing and rhonchi present. No rales.  Abdominal:     General: Bowel sounds are normal.     Palpations: There is no mass.     Tenderness: There is no abdominal tenderness. There  is no right CVA tenderness or guarding.     Hernia: No hernia is present. There is no hernia in the left inguinal area or right inguinal area.  Genitourinary:    Pubic Area: No rash.      Penis: Normal and circumcised.      Testes: Normal.     Epididymis:     Right: Normal.     Left: Normal.     Prostate: Enlarged. Not tender and no nodules present.     Rectum: Normal. Guaiac result negative. No mass, tenderness, anal fissure, external hemorrhoid or internal hemorrhoid. Normal anal tone.  Musculoskeletal:         General: Normal range of motion.     Cervical back: Neck supple.     Right lower leg: No edema.     Left lower leg: No edema.  Lymphadenopathy:     Cervical: No cervical adenopathy.     Lower Body: No right inguinal adenopathy. No left inguinal adenopathy.  Skin:    General: Skin is warm and dry.     Coloration: Skin is not pale.     Findings: No rash.  Neurological:     General: No focal deficit present.     Mental Status: He is alert.    Lab Results  Component Value Date   WBC 8.1 12/14/2020   HGB 16.6 12/14/2020   HCT 49.0 12/14/2020   PLT 256.0 12/14/2020   GLUCOSE 109 (H) 12/14/2020   CHOL 174 12/14/2020   TRIG 242.0 (H) 12/14/2020   HDL 42.80 12/14/2020   LDLDIRECT 94.0 12/14/2020   LDLCALC 89 04/21/2019   ALT 14 12/14/2020   AST 13 12/14/2020   NA 139 12/14/2020   K 4.1 12/14/2020   CL 101 12/14/2020   CREATININE 0.99 12/14/2020   BUN 15 12/14/2020   CO2 29 12/14/2020   PSA 0.49 12/14/2020   HGBA1C 5.7 12/14/2020    DG Chest 2 View  Result Date: 04/15/2018 CLINICAL DATA:  Chronic cough.  Wheezing.  Smoker. EXAM: CHEST - 2 VIEW COMPARISON:  None. FINDINGS: The heart size and mediastinal contours are within normal limits. Both lungs are clear. Pulmonary hyperinflation noted, consistent with COPD. The visualized skeletal structures are unremarkable. IMPRESSION: COPD.  No active cardiopulmonary disease. Electronically Signed   By: Myles Rosenthal M.D.   On: 04/15/2018 23:27    Assessment & Plan:   Donald Chavez was seen today for annual exam.  Diagnoses and all orders for this visit:  Hyperglycemia- His A1c is normal. -     Basic metabolic panel; Future -     Basic metabolic panel  Routine general medical examination at a health care facility- Exam completed, labs reviewed - Statin therapy is not indicated, vaccines reviewed and updated, cancer screenings addressed, patient education was given. -     Lipid panel; Future -     PSA; Future -     Hepatic function panel;  Future -     Hemoglobin A1c; Future -     Hepatitis C antibody; Future -     Hepatitis C antibody -     Hemoglobin A1c -     Hepatic function panel -     PSA -     Lipid panel  Tobacco abuse -     Ambulatory Referral for Lung Cancer Scre  COPD with acute exacerbation (HCC)  Simple chronic bronchitis (HCC)- I encouraged him to quit smoking and to continue using the LAMA/LABA inhaler. -  CBC with Differential/Platelet; Future -     umeclidinium-vilanterol (ANORO ELLIPTA) 62.5-25 MCG/INH AEPB; USE ONE PUFF INTO THE LUNGS DAILY -     CBC with Differential/Platelet  Colon cancer screening -     Cologuard  Need for hepatitis C screening test -     Hepatitis C antibody; Future -     Hepatitis C antibody  Need for vaccination -     Pneumococcal conjugate vaccine 20-valent  Erectile dysfunction due to arterial insufficiency -     tadalafil (CIALIS) 20 MG tablet; Take 0.5-1 tablets (10-20 mg total) by mouth once a week.  Other orders -     LDL cholesterol, direct  I have discontinued Donald Tonne. Chavez's doxycycline. I am also having him start on tadalafil. Additionally, I am having him maintain his albuterol and Anoro Ellipta.  Meds ordered this encounter  Medications   umeclidinium-vilanterol (ANORO ELLIPTA) 62.5-25 MCG/INH AEPB    Sig: USE ONE PUFF INTO THE LUNGS DAILY    Dispense:  120 each    Refill:  1   tadalafil (CIALIS) 20 MG tablet    Sig: Take 0.5-1 tablets (10-20 mg total) by mouth once a week.    Dispense:  6 tablet    Refill:  3      Follow-up: Return in about 6 months (around 06/16/2021).  Sanda Linger, MD

## 2020-12-14 NOTE — Patient Instructions (Signed)
Health Maintenance, Male Adopting a healthy lifestyle and getting preventive care are important in promoting health and wellness. Ask your health care provider about: The right schedule for you to have regular tests and exams. Things you can do on your own to prevent diseases and keep yourself healthy. What should I know about diet, weight, and exercise? Eat a healthy diet  Eat a diet that includes plenty of vegetables, fruits, low-fat dairy products, and lean protein. Do not eat a lot of foods that are high in solid fats, added sugars, or sodium.  Maintain a healthy weight Body mass index (BMI) is a measurement that can be used to identify possible weight problems. It estimates body fat based on height and weight. Your health care provider can help determine your BMI and help you achieve or maintain ahealthy weight. Get regular exercise Get regular exercise. This is one of the most important things you can do for your health. Most adults should: Exercise for at least 150 minutes each week. The exercise should increase your heart rate and make you sweat (moderate-intensity exercise). Do strengthening exercises at least twice a week. This is in addition to the moderate-intensity exercise. Spend less time sitting. Even light physical activity can be beneficial. Watch cholesterol and blood lipids Have your blood tested for lipids and cholesterol at 56 years of age, then havethis test every 5 years. You may need to have your cholesterol levels checked more often if: Your lipid or cholesterol levels are high. You are older than 56 years of age. You are at high risk for heart disease. What should I know about cancer screening? Many types of cancers can be detected early and may often be prevented. Depending on your health history and family history, you may need to have cancer screening at various ages. This may include screening for: Colorectal cancer. Prostate cancer. Skin cancer. Lung  cancer. What should I know about heart disease, diabetes, and high blood pressure? Blood pressure and heart disease High blood pressure causes heart disease and increases the risk of stroke. This is more likely to develop in people who have high blood pressure readings, are of African descent, or are overweight. Talk with your health care provider about your target blood pressure readings. Have your blood pressure checked: Every 3-5 years if you are 18-39 years of age. Every year if you are 40 years old or older. If you are between the ages of 65 and 75 and are a current or former smoker, ask your health care provider if you should have a one-time screening for abdominal aortic aneurysm (AAA). Diabetes Have regular diabetes screenings. This checks your fasting blood sugar level. Have the screening done: Once every three years after age 45 if you are at a normal weight and have a low risk for diabetes. More often and at a younger age if you are overweight or have a high risk for diabetes. What should I know about preventing infection? Hepatitis B If you have a higher risk for hepatitis B, you should be screened for this virus. Talk with your health care provider to find out if you are at risk forhepatitis B infection. Hepatitis C Blood testing is recommended for: Everyone born from 1945 through 1965. Anyone with known risk factors for hepatitis C. Sexually transmitted infections (STIs) You should be screened each year for STIs, including gonorrhea and chlamydia, if: You are sexually active and are younger than 56 years of age. You are older than 56 years of age   and your health care provider tells you that you are at risk for this type of infection. Your sexual activity has changed since you were last screened, and you are at increased risk for chlamydia or gonorrhea. Ask your health care provider if you are at risk. Ask your health care provider about whether you are at high risk for HIV.  Your health care provider may recommend a prescription medicine to help prevent HIV infection. If you choose to take medicine to prevent HIV, you should first get tested for HIV. You should then be tested every 3 months for as long as you are taking the medicine. Follow these instructions at home: Lifestyle Do not use any products that contain nicotine or tobacco, such as cigarettes, e-cigarettes, and chewing tobacco. If you need help quitting, ask your health care provider. Do not use street drugs. Do not share needles. Ask your health care provider for help if you need support or information about quitting drugs. Alcohol use Do not drink alcohol if your health care provider tells you not to drink. If you drink alcohol: Limit how much you have to 0-2 drinks a day. Be aware of how much alcohol is in your drink. In the [place] one drink equals one 12 oz bottle of beer (355 mL), one 5 oz glass of wine (148 mL), or one 1 oz glass of hard liquor (44 mL). General instructions Schedule regular health, dental, and eye exams. Stay current with your vaccines. Tell your health care provider if: You often feel depressed. You have ever been abused or do not feel safe at home. Summary Adopting a healthy lifestyle and getting preventive care are important in promoting health and wellness. Follow your health care provider's instructions about healthy diet, exercising, and getting tested or screened for diseases. Follow your health care provider's instructions on monitoring your cholesterol and blood pressure. This information is not intended to replace advice given to you by your health care provider. Make sure you discuss any questions you have with your healthcare provider. Document Revised: 04/29/2018 Document Reviewed: 04/29/2018 Elsevier Patient Education  2022 [employer]  

## 2020-12-30 ENCOUNTER — Encounter: Payer: Self-pay | Admitting: Internal Medicine

## 2021-09-15 ENCOUNTER — Other Ambulatory Visit: Payer: Self-pay | Admitting: Internal Medicine

## 2021-09-15 DIAGNOSIS — J441 Chronic obstructive pulmonary disease with (acute) exacerbation: Secondary | ICD-10-CM

## 2022-12-04 ENCOUNTER — Ambulatory Visit (INDEPENDENT_AMBULATORY_CARE_PROVIDER_SITE_OTHER): Payer: PRIVATE HEALTH INSURANCE | Admitting: Internal Medicine

## 2022-12-04 ENCOUNTER — Encounter: Payer: Self-pay | Admitting: Internal Medicine

## 2022-12-04 VITALS — BP 160/82 | HR 60 | Temp 98.2°F | Resp 16 | Ht 77.0 in | Wt 199.0 lb

## 2022-12-04 DIAGNOSIS — E785 Hyperlipidemia, unspecified: Secondary | ICD-10-CM | POA: Diagnosis not present

## 2022-12-04 DIAGNOSIS — Z1159 Encounter for screening for other viral diseases: Secondary | ICD-10-CM | POA: Insufficient documentation

## 2022-12-04 DIAGNOSIS — J410 Simple chronic bronchitis: Secondary | ICD-10-CM | POA: Diagnosis not present

## 2022-12-04 DIAGNOSIS — N400 Enlarged prostate without lower urinary tract symptoms: Secondary | ICD-10-CM | POA: Diagnosis not present

## 2022-12-04 DIAGNOSIS — I10 Essential (primary) hypertension: Secondary | ICD-10-CM | POA: Insufficient documentation

## 2022-12-04 DIAGNOSIS — Z0001 Encounter for general adult medical examination with abnormal findings: Secondary | ICD-10-CM | POA: Insufficient documentation

## 2022-12-04 DIAGNOSIS — Z720 Tobacco use: Secondary | ICD-10-CM

## 2022-12-04 DIAGNOSIS — R195 Other fecal abnormalities: Secondary | ICD-10-CM | POA: Diagnosis not present

## 2022-12-04 DIAGNOSIS — Z0000 Encounter for general adult medical examination without abnormal findings: Secondary | ICD-10-CM

## 2022-12-04 DIAGNOSIS — E7841 Elevated Lipoprotein(a): Secondary | ICD-10-CM

## 2022-12-04 DIAGNOSIS — R3121 Asymptomatic microscopic hematuria: Secondary | ICD-10-CM | POA: Insufficient documentation

## 2022-12-04 LAB — CBC WITH DIFFERENTIAL/PLATELET
Basophils Absolute: 0.1 10*3/uL (ref 0.0–0.1)
Basophils Relative: 0.7 % (ref 0.0–3.0)
Eosinophils Absolute: 0.2 10*3/uL (ref 0.0–0.7)
Eosinophils Relative: 2.2 % (ref 0.0–5.0)
HCT: 49.5 % (ref 39.0–52.0)
Hemoglobin: 16.8 g/dL (ref 13.0–17.0)
Lymphocytes Relative: 24 % (ref 12.0–46.0)
Lymphs Abs: 1.9 10*3/uL (ref 0.7–4.0)
MCHC: 33.9 g/dL (ref 30.0–36.0)
MCV: 90.7 fl (ref 78.0–100.0)
Monocytes Absolute: 0.8 10*3/uL (ref 0.1–1.0)
Monocytes Relative: 9.6 % (ref 3.0–12.0)
Neutro Abs: 4.9 10*3/uL (ref 1.4–7.7)
Neutrophils Relative %: 63.5 % (ref 43.0–77.0)
Platelets: 280 10*3/uL (ref 150.0–400.0)
RBC: 5.46 Mil/uL (ref 4.22–5.81)
RDW: 12.7 % (ref 11.5–15.5)
WBC: 7.8 10*3/uL (ref 4.0–10.5)

## 2022-12-04 LAB — BASIC METABOLIC PANEL
BUN: 20 mg/dL (ref 6–23)
CO2: 29 mEq/L (ref 19–32)
Calcium: 9.9 mg/dL (ref 8.4–10.5)
Chloride: 103 mEq/L (ref 96–112)
Creatinine, Ser: 0.96 mg/dL (ref 0.40–1.50)
GFR: 87.55 mL/min (ref >60.00)
Glucose, Bld: 106 mg/dL — ABNORMAL HIGH (ref 70–99)
Potassium: 4.9 mEq/L (ref 3.5–5.1)
Sodium: 141 mEq/L (ref 135–145)

## 2022-12-04 LAB — URINALYSIS, ROUTINE W REFLEX MICROSCOPIC
Bilirubin Urine: NEGATIVE
Ketones, ur: NEGATIVE
Leukocytes,Ua: NEGATIVE
Nitrite: NEGATIVE
Specific Gravity, Urine: >=1.03 — AB (ref 1.000–1.030)
Urine Glucose: NEGATIVE
Urobilinogen, UA: 0.2 (ref 0.0–1.0)
pH: 6 (ref 5.0–8.0)

## 2022-12-04 LAB — LIPID PANEL
Cholesterol: 185 mg/dL (ref 0–200)
HDL: 41.1 mg/dL (ref >39.00)
LDL Cholesterol: 111 mg/dL — ABNORMAL HIGH (ref 0–99)
NonHDL: 143.98
Total CHOL/HDL Ratio: 5
Triglycerides: 164 mg/dL — ABNORMAL HIGH (ref 0.0–149.0)
VLDL: 32.8 mg/dL (ref 0.0–40.0)

## 2022-12-04 LAB — HEPATIC FUNCTION PANEL
ALT: 13 U/L (ref 0–53)
AST: 15 U/L (ref 0–37)
Albumin: 4.6 g/dL (ref 3.5–5.2)
Alkaline Phosphatase: 69 U/L (ref 39–117)
Bilirubin, Direct: 0.2 mg/dL (ref 0.0–0.3)
Total Bilirubin: 1 mg/dL (ref 0.2–1.2)
Total Protein: 7.3 g/dL (ref 6.0–8.3)

## 2022-12-04 LAB — PSA: PSA: 0.44 ng/mL (ref 0.10–4.00)

## 2022-12-04 LAB — TSH: TSH: 0.73 u[IU]/mL (ref 0.35–5.50)

## 2022-12-04 MED ORDER — UMECLIDINIUM-VILANTEROL 62.5-25 MCG/ACT IN AEPB
1.0000 | INHALATION_SPRAY | Freq: Every day | RESPIRATORY_TRACT | 0 refills | Status: DC
Start: 2022-12-04 — End: 2023-12-09

## 2022-12-04 MED ORDER — ROSUVASTATIN CALCIUM 10 MG PO TABS: 10.0000 mg | ORAL_TABLET | Freq: Every day | ORAL | 0 refills | Status: DC

## 2022-12-04 MED ORDER — OLMESARTAN MEDOXOMIL 20 MG PO TABS: 20.0000 mg | ORAL_TABLET | Freq: Every day | ORAL | 0 refills | Status: DC

## 2022-12-04 MED ORDER — INDAPAMIDE 1.25 MG PO TABS: 1.2500 mg | ORAL_TABLET | Freq: Every day | ORAL | 0 refills | Status: DC

## 2022-12-04 NOTE — Progress Notes (Signed)
Subjective:  Patient ID: Donald Chavez, male    DOB: March 27, 1966  Age: 57 y.o. MRN: [id number]  CC: Annual Exam, Hypertension, Hyperlipidemia, and COPD   HPI Harsimran Tlaseca Twitty presents for a CPX and f/up ----  Discussed the use of AI scribe software for clinical note transcription with the patient, who gave verbal consent to proceed.  History of Present Illness   The patient, a current smoker with a pack and a half per day habit, reports evening congestion, particularly when lying down. He occasionally uses an inhaler for symptom relief. He denies hemoptysis. The patient also consumes two to three beers daily.  The patient's blood pressure was noted to be elevated, but he denies any associated symptoms such as headache or blurred vision. Despite the respiratory symptoms and high blood pressure, the patient remains active and does not report any fatigue, cold sweats, or chest pain during activity.       Outpatient Medications Prior to Visit  Medication Sig Dispense Refill   albuterol (VENTOLIN HFA) 108 (90 Base) MCG/ACT inhaler INHALE 2 PUFFS INTO THE LUNGS EVERY 6 HOURS AS NEEDED FOR WHEEZING OR SHORTNESS OF BREATH. SHAKE WELL BEFORE EACH USE. 54 g 1   tadalafil (CIALIS) 20 MG tablet Take 0.5-1 tablets (10-20 mg total) by mouth once a week. 6 tablet 3   umeclidinium-vilanterol (ANORO ELLIPTA) 62.5-25 MCG/INH AEPB USE ONE PUFF INTO THE LUNGS DAILY 120 each 1   No facility-administered medications prior to visit.    ROS Review of Systems  Constitutional:  Negative for appetite change, chills, diaphoresis, fatigue and fever.  HENT: Negative.    Eyes: Negative.   Respiratory:  Positive for wheezing. Negative for cough, choking, chest tightness, shortness of breath and stridor.   Cardiovascular:  Negative for chest pain, palpitations and leg swelling.  Gastrointestinal:  Negative for abdominal pain, blood in stool, constipation, diarrhea, nausea and vomiting.  Genitourinary: Negative.   Negative for difficulty urinating, hematuria, penile swelling and scrotal swelling.  Musculoskeletal: Negative.  Negative for arthralgias, back pain, myalgias and neck pain.  Skin: Negative.  Negative for rash.  Neurological:  Negative for dizziness, weakness, light-headedness and headaches.  Hematological:  Negative for adenopathy. Does not bruise/bleed easily.  Psychiatric/Behavioral: Negative.      Objective:  BP (!) 160/82 (BP Location: Left Arm, Patient Position: Sitting, Cuff Size: Large)   Pulse 60   Temp 98.2 F (36.8 C) (Oral)   Resp 16   Ht 6\' 5"  (1.956 m)   Wt 199 lb (90.3 kg)   SpO2 93%   BMI 23.60 kg/m   BP Readings from Last 3 Encounters:  12/04/22 (!) 160/82  12/14/20 124/80  11/18/19 122/78    Wt Readings from Last 3 Encounters:  12/04/22 199 lb (90.3 kg)  12/14/20 204 lb 12.8 oz (92.9 kg)  11/18/19 201 lb 4 oz (91.3 kg)    Physical Exam Vitals reviewed.  Constitutional:      Appearance: Normal appearance. He is not ill-appearing.  HENT:     Nose: Nose normal.     Mouth/Throat:     Mouth: Mucous membranes are moist.  Eyes:     General: No scleral icterus.    Conjunctiva/sclera: Conjunctivae normal.  Cardiovascular:     Rate and Rhythm: Normal rate and regular rhythm.     Heart sounds: Normal heart sounds, S1 normal and S2 normal. No murmur heard.    No friction rub. No gallop.     Comments: EKG- NSR, 61  bpm No LVH, Q waves, or ST/T wave changes Normal EKG Pulmonary:     Effort: Pulmonary effort is normal.     Breath sounds: No stridor. No wheezing, rhonchi or rales.  Abdominal:     General: Abdomen is flat.     Palpations: There is no mass.     Tenderness: There is no abdominal tenderness. There is no guarding.     Hernia: No hernia is present. There is no hernia in the left inguinal area or right inguinal area.  Genitourinary:    Pubic Area: No rash.      Penis: Normal and circumcised.      Testes: Normal.     Epididymis:     Right:  Normal.     Left: Normal.     Prostate: Enlarged. Not tender and no nodules present.     Rectum: Normal. Guaiac result negative. No mass, tenderness, anal fissure, external hemorrhoid or internal hemorrhoid. Normal anal tone.  Musculoskeletal:     Cervical back: Neck supple.     Right lower leg: No edema.     Left lower leg: No edema.  Lymphadenopathy:     Cervical: No cervical adenopathy.     Lower Body: No right inguinal adenopathy. No left inguinal adenopathy.  Skin:    General: Skin is warm and dry.  Neurological:     General: No focal deficit present.     Mental Status: He is alert. Mental status is at baseline.  Psychiatric:        Mood and Affect: Mood normal.        Behavior: Behavior normal.        Thought Content: Thought content normal.     Lab Results  Component Value Date   WBC 7.8 12/04/2022   HGB 16.8 12/04/2022   HCT 49.5 12/04/2022   PLT 280.0 12/04/2022   GLUCOSE 106 (H) 12/04/2022   CHOL 185 12/04/2022   TRIG 164.0 (H) 12/04/2022   HDL 41.10 12/04/2022   LDLDIRECT 94.0 12/14/2020   LDLCALC 111 (H) 12/04/2022   ALT 13 12/04/2022   AST 15 12/04/2022   NA 141 12/04/2022   K 4.9 12/04/2022   CL 103 12/04/2022   CREATININE 0.96 12/04/2022   BUN 20 12/04/2022   CO2 29 12/04/2022   TSH 0.73 12/04/2022   PSA 0.44 12/04/2022   HGBA1C 5.7 12/14/2020    DG Chest 2 View  Result Date: 04/15/2018 CLINICAL DATA:  Chronic cough.  Wheezing.  Smoker. EXAM: CHEST - 2 VIEW COMPARISON:  None. FINDINGS: The heart size and mediastinal contours are within normal limits. Both lungs are clear. Pulmonary hyperinflation noted, consistent with COPD. The visualized skeletal structures are unremarkable. IMPRESSION: COPD.  No active cardiopulmonary disease. Electronically Signed   By: Myles Rosenthal M.D.   On: 04/15/2018 23:27    Assessment & Plan:   Simple chronic bronchitis (HCC) -     Umeclidinium-Vilanterol; Inhale 1 puff into the lungs daily at 6 (six) AM.  Dispense: 120  each; Refill: 0  Tobacco abuse -     Ambulatory Referral for Lung Cancer Scre  Need for hepatitis C screening test -     Hepatitis C antibody; Future  Heme + stool -     Ambulatory referral to Gastroenterology  Primary hypertension- EKG is negative for LVH.  Will treat with an ARB and thiazide diuretic. -     TSH; Future -     Urinalysis, Routine w reflex microscopic; Future -  Hepatic function panel; Future -     CBC with Differential/Platelet; Future -     Basic metabolic panel; Future -     EKG 12-Lead -     Olmesartan Medoxomil; Take 1 tablet (20 mg total) by mouth daily.  Dispense: 90 tablet; Refill: 0 -     Indapamide; Take 1 tablet (1.25 mg total) by mouth daily.  Dispense: 90 tablet; Refill: 0  Encounter for general adult medical examination with abnormal findings- Exam completed, labs reviewed, vaccines reviewed, cancer screenings addressed, patient education was given. -     PSA; Future  Benign prostatic hyperplasia without lower urinary tract symptoms -     PSA; Future  Dyslipidemia, goal LDL below 130 -     Lipoprotein A (LPA); Future -     Lipid panel; Future -     TSH; Future -     Hepatic function panel; Future -     Rosuvastatin Calcium; Take 1 tablet (10 mg total) by mouth daily.  Dispense: 90 tablet; Refill: 0  Asymptomatic microscopic hematuria - He is high risk.  I have ordered a renal CT to evaluate for renal cell carcinoma. -     CT RENAL STONE STUDY; Future  High serum lipoprotein(a) - I have asked him to start taking a statin and a baby aspirin for cardiovascular risk reduction.     Follow-up: Return in about 3 months (around 03/06/2023).  Sanda Linger, MD

## 2022-12-04 NOTE — Patient Instructions (Signed)
Health Maintenance, Male Adopting a healthy lifestyle and getting preventive care are important in promoting health and wellness. Ask your health care provider about: The right schedule for you to have regular tests and exams. Things you can do on your own to prevent diseases and keep yourself healthy. What should I know about diet, weight, and exercise? Eat a healthy diet  Eat a diet that includes plenty of vegetables, fruits, low-fat dairy products, and lean protein. Do not eat a lot of foods that are high in solid fats, added sugars, or sodium. Maintain a healthy weight Body mass index (BMI) is a measurement that can be used to identify possible weight problems. It estimates body fat based on height and weight. Your health care provider can help determine your BMI and help you achieve or maintain a healthy weight. Get regular exercise Get regular exercise. This is one of the most important things you can do for your health. Most adults should: Exercise for at least 150 minutes each week. The exercise should increase your heart rate and make you sweat (moderate-intensity exercise). Do strengthening exercises at least twice a week. This is in addition to the moderate-intensity exercise. Spend less time sitting. Even light physical activity can be beneficial. Watch cholesterol and blood lipids Have your blood tested for lipids and cholesterol at 58 years of age, then have this test every 5 years. You may need to have your cholesterol levels checked more often if: Your lipid or cholesterol levels are high. You are older than 58 years of age. You are at high risk for heart disease. What should I know about cancer screening? Many types of cancers can be detected early and may often be prevented. Depending on your health history and family history, you may need to have cancer screening at various ages. This may include screening for: Colorectal cancer. Prostate cancer. Skin cancer. Lung  cancer. What should I know about heart disease, diabetes, and high blood pressure? Blood pressure and heart disease High blood pressure causes heart disease and increases the risk of stroke. This is more likely to develop in people who have high blood pressure readings or are overweight. Talk with your health care provider about your target blood pressure readings. Have your blood pressure checked: Every 3-5 years if you are 18-39 years of age. Every year if you are 40 years old or older. If you are between the ages of 65 and 75 and are a current or former smoker, ask your health care provider if you should have a one-time screening for abdominal aortic aneurysm (AAA). Diabetes Have regular diabetes screenings. This checks your fasting blood sugar level. Have the screening done: Once every three years after age 45 if you are at a normal weight and have a low risk for diabetes. More often and at a younger age if you are overweight or have a high risk for diabetes. What should I know about preventing infection? Hepatitis B If you have a higher risk for hepatitis B, you should be screened for this virus. Talk with your health care provider to find out if you are at risk for hepatitis B infection. Hepatitis C Blood testing is recommended for: Everyone born from 1945 through 1965. Anyone with known risk factors for hepatitis C. Sexually transmitted infections (STIs) You should be screened each year for STIs, including gonorrhea and chlamydia, if: You are sexually active and are younger than 58 years of age. You are older than 58 years of age and your   health care provider tells you that you are at risk for this type of infection. Your sexual activity has changed since you were last screened, and you are at increased risk for chlamydia or gonorrhea. Ask your health care provider if you are at risk. Ask your health care provider about whether you are at high risk for HIV. Your health care provider  may recommend a prescription medicine to help prevent HIV infection. If you choose to take medicine to prevent HIV, you should first get tested for HIV. You should then be tested every 3 months for as long as you are taking the medicine. Follow these instructions at home: Alcohol use Do not drink alcohol if your health care provider tells you not to drink. If you drink alcohol: Limit how much you have to 0-2 drinks a day. Know how much alcohol is in your drink. In the [place] one drink equals one 12 oz bottle of beer (355 mL), one 5 oz glass of wine (148 mL), or one 1 oz glass of hard liquor (44 mL). Lifestyle Do not use any products that contain nicotine or tobacco. These products include cigarettes, chewing tobacco, and vaping devices, such as e-cigarettes. If you need help quitting, ask your health care provider. Do not use street drugs. Do not share needles. Ask your health care provider for help if you need support or information about quitting drugs. General instructions Schedule regular health, dental, and eye exams. Stay current with your vaccines. Tell your health care provider if: You often feel depressed. You have ever been abused or do not feel safe at home. Summary Adopting a healthy lifestyle and getting preventive care are important in promoting health and wellness. Follow your health care provider's instructions about healthy diet, exercising, and getting tested or screened for diseases. Follow your health care provider's instructions on monitoring your cholesterol and blood pressure. This information is not intended to replace advice given to you by your health care provider. Make sure you discuss any questions you have with your health care provider. Document Revised: 09/25/2020 Document Reviewed: 09/25/2020 Elsevier Patient Education  2024 [employer]vier Inc.  

## 2022-12-06 ENCOUNTER — Telehealth: Payer: Self-pay | Admitting: Internal Medicine

## 2022-12-06 NOTE — Telephone Encounter (Signed)
Patient would like an update on setting up a CT scan and antibiotics. Pt was last seen Wednesday. Please advise

## 2022-12-08 DIAGNOSIS — E7841 Elevated Lipoprotein(a): Secondary | ICD-10-CM | POA: Insufficient documentation

## 2022-12-08 LAB — HEPATITIS C ANTIBODY: Hepatitis C Ab: NONREACTIVE

## 2022-12-08 LAB — LIPOPROTEIN A (LPA): Lipoprotein (a): 206 nmol/L — ABNORMAL HIGH (ref <75)

## 2022-12-10 ENCOUNTER — Encounter: Payer: Self-pay | Admitting: Radiology

## 2022-12-12 ENCOUNTER — Encounter: Payer: Self-pay | Admitting: Gastroenterology

## 2022-12-25 ENCOUNTER — Other Ambulatory Visit: Payer: No Typology Code available for payment source

## 2023-01-28 ENCOUNTER — Ambulatory Visit: Payer: No Typology Code available for payment source | Admitting: Gastroenterology

## 2023-01-28 NOTE — Progress Notes (Deleted)
[facility] Consult Note:  History: Donald Chavez 01/28/2023  Referring provider: Etta Grandchild, MD  Reason for consult/chief complaint: No chief complaint on file.   Subjective  HPI: Donald Chavez is a 57 year old man referred by primary care for blood in the stool. ***    Lab result review indicates he had Cologuard studies ordered in 2019 and 2020, but they do not appear to have been done as there are no results.  ROS:  Review of Systems   Past Medical History: Past Medical History:  Diagnosis Date   Chronic bronchitis (HCC)    COPD (chronic obstructive pulmonary disease) (HCC)    ED (erectile dysfunction)    Hyperglycemia    Hypertension    Tobacco abuse      Past Surgical History: No past surgical history on file.   Family History: Family History  Problem Relation Age of Onset   Hypertension Mother    COPD Father    CAD Father    Alcoholism Father     Social History: Social History   Socioeconomic History   Marital status: Married    Spouse name: Not on file   Number of children: Not on file   Years of education: Not on file   Highest education level: Not on file  Occupational History   Not on file  Tobacco Use   Smoking status: Every Day    Current packs/day: 1.50    Average packs/day: 1.5 packs/day for 33.0 years (49.5 ttl pk-yrs)    Types: Cigarettes   Smokeless tobacco: Never  Substance and Sexual Activity   Alcohol use: Yes    Alcohol/week: 5.0 standard drinks of alcohol    Types: 5 Shots of liquor per week   Drug use: Yes    Types: Marijuana   Sexual activity: Not Currently    Partners: Female  Other Topics Concern   Not on file  Social History Narrative   Not on file   Social Determinants of Health   Financial Resource Strain: Not on file  Food Insecurity: Not on file  Transportation Needs: Not on file  Physical Activity: Not on file  Stress: Not on file  Social Connections: Not on file     Allergies: Allergies  Allergen Reactions   Penicillins Other (See Comments)    Unable to move    Outpatient Meds: Current Outpatient Medications  Medication Sig Dispense Refill   albuterol (VENTOLIN HFA) 108 (90 Base) MCG/ACT inhaler INHALE 2 PUFFS INTO THE LUNGS EVERY 6 HOURS AS NEEDED FOR WHEEZING OR SHORTNESS OF BREATH. SHAKE WELL BEFORE EACH USE. 54 g 1   indapamide (LOZOL) 1.25 MG tablet Take 1 tablet (1.25 mg total) by mouth daily. 90 tablet 0   olmesartan (BENICAR) 20 MG tablet Take 1 tablet (20 mg total) by mouth daily. 90 tablet 0   rosuvastatin (CRESTOR) 10 MG tablet Take 1 tablet (10 mg total) by mouth daily. 90 tablet 0   tadalafil (CIALIS) 20 MG tablet Take 0.5-1 tablets (10-20 mg total) by mouth once a week. 6 tablet 3   umeclidinium-vilanterol (ANORO ELLIPTA) 62.5-25 MCG/ACT AEPB Inhale 1 puff into the lungs daily at 6 (six) AM. 120 each 0   No current facility-administered medications for this visit.      ___________________________________________________________________ Objective   Exam:  There were no vitals taken for this visit. Wt Readings from Last 3 Encounters:  12/04/22 199 lb (90.3 kg)  12/14/20 204 lb 12.8 oz (92.9 kg)  11/18/19 201  lb 4 oz (91.3 kg)    General: ***  Eyes: sclera anicteric, no redness ENT: oral mucosa moist without lesions, no cervical or supraclavicular lymphadenopathy CV: ***, no JVD, no peripheral edema Resp: clear to auscultation bilaterally, normal RR and effort noted GI: soft, *** tenderness, with active bowel sounds. No guarding or palpable organomegaly noted. Skin; warm and dry, no rash or jaundice noted Neuro: awake, alert and oriented x 3. Normal gross motor function and fluent speech  Labs:     Latest Ref Rng & Units 12/04/2022   10:29 AM 12/14/2020    9:47 AM 04/15/2018    3:16 PM  CBC  WBC 4.0 - 10.5 K/uL 7.8  8.1  9.8   Hemoglobin 13.0 - 17.0 g/dL 46.9  62.9  52.8   Hematocrit 39.0 - 52.0 % 49.5  49.0   45.4   Platelets 150.0 - 400.0 K/uL 280.0  256.0  311.0       Latest Ref Rng & Units 12/04/2022   10:29 AM 12/14/2020    9:47 AM 04/21/2019    2:01 PM  CMP  Glucose 70 - 99 mg/dL 413  244  010   BUN 6 - 23 mg/dL 20  15  18    Creatinine 0.40 - 1.50 mg/dL 2.72  5.36  6.44   Sodium 135 - 145 mEq/L 141  139  139   Potassium 3.5 - 5.1 mEq/L 4.9  4.1  4.1   Chloride 96 - 112 mEq/L 103  101  104   CO2 19 - 32 mEq/L 29  29  28    Calcium 8.4 - 10.5 mg/dL 9.9  9.5  9.4   Total Protein 6.0 - 8.3 g/dL 7.3  7.1    Total Bilirubin 0.2 - 1.2 mg/dL 1.0  0.9    Alkaline Phos 39 - 117 U/L 69  66    AST 0 - 37 U/L 15  13    ALT 0 - 53 U/L 13  14       Radiologic Studies:  ***  Assessment: No diagnosis found.  ***  Plan:  ***  Thank you for the courtesy of this consult.  Please call me with any questions or concerns.  Charlie Pitter III  CC: Referring provider noted above

## 2023-02-13 ENCOUNTER — Other Ambulatory Visit: Payer: Self-pay

## 2023-02-13 DIAGNOSIS — Z122 Encounter for screening for malignant neoplasm of respiratory organs: Secondary | ICD-10-CM

## 2023-02-13 DIAGNOSIS — Z87891 Personal history of nicotine dependence: Secondary | ICD-10-CM

## 2023-02-13 DIAGNOSIS — F17210 Nicotine dependence, cigarettes, uncomplicated: Secondary | ICD-10-CM

## 2023-02-19 ENCOUNTER — Encounter: Payer: No Typology Code available for payment source | Admitting: Adult Health

## 2023-02-21 ENCOUNTER — Ambulatory Visit (HOSPITAL_COMMUNITY): Payer: No Typology Code available for payment source

## 2023-12-09 ENCOUNTER — Telehealth: Payer: Self-pay

## 2023-12-09 ENCOUNTER — Encounter: Payer: Self-pay | Admitting: Internal Medicine

## 2023-12-09 ENCOUNTER — Ambulatory Visit (INDEPENDENT_AMBULATORY_CARE_PROVIDER_SITE_OTHER): Admitting: Internal Medicine

## 2023-12-09 VITALS — BP 152/86 | HR 58 | Temp 98.2°F | Resp 16 | Ht 77.0 in | Wt 193.0 lb

## 2023-12-09 DIAGNOSIS — R739 Hyperglycemia, unspecified: Secondary | ICD-10-CM

## 2023-12-09 DIAGNOSIS — F17210 Nicotine dependence, cigarettes, uncomplicated: Secondary | ICD-10-CM

## 2023-12-09 DIAGNOSIS — R5383 Other fatigue: Secondary | ICD-10-CM

## 2023-12-09 DIAGNOSIS — J410 Simple chronic bronchitis: Secondary | ICD-10-CM

## 2023-12-09 DIAGNOSIS — R3121 Asymptomatic microscopic hematuria: Secondary | ICD-10-CM | POA: Diagnosis not present

## 2023-12-09 DIAGNOSIS — Z1211 Encounter for screening for malignant neoplasm of colon: Secondary | ICD-10-CM

## 2023-12-09 DIAGNOSIS — Z87891 Personal history of nicotine dependence: Secondary | ICD-10-CM

## 2023-12-09 DIAGNOSIS — I10 Essential (primary) hypertension: Secondary | ICD-10-CM | POA: Diagnosis not present

## 2023-12-09 DIAGNOSIS — E785 Hyperlipidemia, unspecified: Secondary | ICD-10-CM | POA: Diagnosis not present

## 2023-12-09 DIAGNOSIS — Z720 Tobacco use: Secondary | ICD-10-CM

## 2023-12-09 DIAGNOSIS — K219 Gastro-esophageal reflux disease without esophagitis: Secondary | ICD-10-CM | POA: Diagnosis not present

## 2023-12-09 DIAGNOSIS — N400 Enlarged prostate without lower urinary tract symptoms: Secondary | ICD-10-CM

## 2023-12-09 DIAGNOSIS — Z122 Encounter for screening for malignant neoplasm of respiratory organs: Secondary | ICD-10-CM

## 2023-12-09 LAB — HEPATIC FUNCTION PANEL
ALT: 14 U/L (ref 0–53)
AST: 14 U/L (ref 0–37)
Albumin: 4.5 g/dL (ref 3.5–5.2)
Alkaline Phosphatase: 61 U/L (ref 39–117)
Bilirubin, Direct: 0.2 mg/dL (ref 0.0–0.3)
Total Bilirubin: 1 mg/dL (ref 0.2–1.2)
Total Protein: 6.9 g/dL (ref 6.0–8.3)

## 2023-12-09 LAB — CBC WITH DIFFERENTIAL/PLATELET
Basophils Absolute: 0.1 K/uL (ref 0.0–0.1)
Basophils Relative: 1.1 % (ref 0.0–3.0)
Eosinophils Absolute: 0.1 K/uL (ref 0.0–0.7)
Eosinophils Relative: 1.4 % (ref 0.0–5.0)
HCT: 50.3 % (ref 39.0–52.0)
Hemoglobin: 16.8 g/dL (ref 13.0–17.0)
Lymphocytes Relative: 22.8 % (ref 12.0–46.0)
Lymphs Abs: 1.9 K/uL (ref 0.7–4.0)
MCHC: 33.5 g/dL (ref 30.0–36.0)
MCV: 90.6 fl (ref 78.0–100.0)
Monocytes Absolute: 0.8 K/uL (ref 0.1–1.0)
Monocytes Relative: 9.6 % (ref 3.0–12.0)
Neutro Abs: 5.5 K/uL (ref 1.4–7.7)
Neutrophils Relative %: 65.1 % (ref 43.0–77.0)
Platelets: 281 K/uL (ref 150.0–400.0)
RBC: 5.55 Mil/uL (ref 4.22–5.81)
RDW: 13.2 % (ref 11.5–15.5)
WBC: 8.4 K/uL (ref 4.0–10.5)

## 2023-12-09 LAB — LIPID PANEL
Cholesterol: 167 mg/dL (ref 0–200)
HDL: 43 mg/dL (ref >39.00)
LDL Cholesterol: 92 mg/dL (ref 0–99)
NonHDL: 124.12
Total CHOL/HDL Ratio: 4
Triglycerides: 159 mg/dL — ABNORMAL HIGH (ref 0.0–149.0)
VLDL: 31.8 mg/dL (ref 0.0–40.0)

## 2023-12-09 LAB — PSA: PSA: 1.45 ng/mL (ref 0.10–4.00)

## 2023-12-09 LAB — BASIC METABOLIC PANEL WITH GFR
BUN: 18 mg/dL (ref 6–23)
CO2: 30 meq/L (ref 19–32)
Calcium: 9.4 mg/dL (ref 8.4–10.5)
Chloride: 102 meq/L (ref 96–112)
Creatinine, Ser: 0.9 mg/dL (ref 0.40–1.50)
GFR: 93.93 mL/min (ref >60.00)
Glucose, Bld: 105 mg/dL — ABNORMAL HIGH (ref 70–99)
Potassium: 4.1 meq/L (ref 3.5–5.1)
Sodium: 139 meq/L (ref 135–145)

## 2023-12-09 LAB — HEMOGLOBIN A1C: Hgb A1c MFr Bld: 6 % (ref 4.6–6.5)

## 2023-12-09 LAB — TSH: TSH: 0.81 u[IU]/mL (ref 0.35–5.50)

## 2023-12-09 MED ORDER — ESOMEPRAZOLE MAGNESIUM 40 MG PO CPDR
40.0000 mg | DELAYED_RELEASE_CAPSULE | Freq: Every day | ORAL | 0 refills | Status: AC
Start: 2023-12-09 — End: ?

## 2023-12-09 MED ORDER — UMECLIDINIUM-VILANTEROL 62.5-25 MCG/ACT IN AEPB: 1.0000 | INHALATION_SPRAY | Freq: Every day | RESPIRATORY_TRACT | 0 refills | Status: DC

## 2023-12-09 NOTE — Telephone Encounter (Signed)
 Lung Cancer Screening Narrative/Criteria Questionnaire (Cigarette Smokers Only- No Cigars/Pipes/vapes)   Donald Chavez   SDMV:12/11/2023 at 11:45 am with [facility]        08-18-1965               LDCT: 12/17/2023    58 y.o.   Phone: [phone]  Lung Screening Narrative (confirm age 41-77 yrs Medicare / 50-80 yrs Private pay insurance)   Insurance information: Medicaid   Referring Provider: Joshua   This screening involves an initial phone call with a team member from our program. It is called a shared decision making visit. The initial meeting is required by  insurance and Medicare to make sure you understand the program. This appointment takes about 15-20 minutes to complete. You will complete the screening scan at your scheduled date/time.  This scan takes about 5-10 minutes to complete. You can eat and drink normally before and after the scan.  Criteria questions for Lung Cancer Screening:   Are you a current or former smoker? Current Age began smoking: 46   If you are a former smoker, what year did you quit smoking? Never quit (within 15 yrs)   To calculate your smoking history, I need an accurate estimate of how many packs of cigarettes you smoked per day and for how many years. (Not just the number of PPD you are now smoking)   Years smoking 40 x Packs per day 2 = Pack years 80   (at least 20 pack yrs)   (Make sure they understand that we need to know how much they have smoked in the past, not just the number of PPD they are smoking now)  Do you have a personal history of cancer?  No    Do you have a family history of cancer? Yes  (cancer type and and relative) Maternal grandfather with Lung Cancer  Are you coughing up blood?  No  Have you had unexplained weight loss of 15 lbs or more in the last 6 months? No  It looks like you meet all criteria.  When would be a good time for us  to schedule you for this screening?   Additional information: N/A

## 2023-12-09 NOTE — Progress Notes (Signed)
 Subjective:  Patient ID: Donald Chavez, male    DOB: 1966/04/18  Age: 58 y.o. MRN: [id number]  CC: Hypertension, COPD, and Gastroesophageal Reflux   HPI Donald Chavez presents for f/up -----  Discussed the use of AI scribe software for clinical note transcription with the patient, who gave verbal consent to proceed.  History of Present Illness Donald Chavez is a 58 year old male who presents with fatigue and bloating.  He has been experiencing fatigue and bloating with heartburn, leading to decreased appetite and an approximate weight loss of eight pounds. No nausea or vomiting, but he describes abdominal pain attributed to gas buildup.  He experiences cough and wheezing at night when lying down. He uses an inhaler and notes significant relief, especially when working outside in hot conditions. He ran out of this medication about ten days ago. No trouble breathing, coughing, wheezing, or shortness of breath during the day. No trouble swallowing, painful swallowing, or blood in stool.       Outpatient Medications Prior to Visit  Medication Sig Dispense Refill   albuterol  (VENTOLIN  HFA) 108 (90 Base) MCG/ACT inhaler INHALE 2 PUFFS INTO THE LUNGS EVERY 6 HOURS AS NEEDED FOR WHEEZING OR SHORTNESS OF BREATH. SHAKE WELL BEFORE EACH USE. 54 g 1   olmesartan  (BENICAR ) 20 MG tablet Take 1 tablet (20 mg total) by mouth daily. 90 tablet 0   indapamide  (LOZOL ) 1.25 MG tablet Take 1 tablet (1.25 mg total) by mouth daily. 90 tablet 0   umeclidinium-vilanterol (ANORO ELLIPTA ) 62.5-25 MCG/ACT AEPB Inhale 1 puff into the lungs daily at 6 (six) AM. 120 each 0   rosuvastatin  (CRESTOR ) 10 MG tablet Take 1 tablet (10 mg total) by mouth daily. (Patient not taking: Reported on 12/09/2023) 90 tablet 0   tadalafil  (CIALIS ) 20 MG tablet Take 0.5-1 tablets (10-20 mg total) by mouth once a week. (Patient not taking: Reported on 12/09/2023) 6 tablet 3   No facility-administered medications prior to visit.     ROS Review of Systems  Constitutional:  Positive for fatigue and unexpected weight change. Negative for appetite change, chills, diaphoresis and fever.  HENT: Negative.  Negative for sore throat and trouble swallowing.   Eyes: Negative.   Respiratory:  Positive for cough, shortness of breath and wheezing. Negative for choking.   Cardiovascular:  Negative for chest pain, palpitations and leg swelling.  Gastrointestinal:  Negative for abdominal pain, anal bleeding, blood in stool, constipation, diarrhea, nausea and vomiting.  Endocrine: Negative.   Genitourinary: Negative.  Negative for difficulty urinating.  Musculoskeletal: Negative.  Negative for arthralgias and myalgias.  Skin: Negative.  Negative for color change and pallor.  Neurological: Negative.  Negative for dizziness and weakness.  Hematological:  Negative for adenopathy. Does not bruise/bleed easily.  Psychiatric/Behavioral:  Positive for sleep disturbance.     Objective:  BP (!) 152/86 (BP Location: Left Arm, Patient Position: Sitting, Cuff Size: Normal)   Pulse (!) 58   Temp 98.2 F (36.8 C) (Oral)   Resp 16   Ht 6' 5 (1.956 m)   Wt 193 lb (87.5 kg)   SpO2 95%   BMI 22.89 kg/m   BP Readings from Last 3 Encounters:  12/09/23 (!) 152/86  12/04/22 (!) 160/82  12/14/20 124/80    Wt Readings from Last 3 Encounters:  12/09/23 193 lb (87.5 kg)  12/04/22 199 lb (90.3 kg)  12/14/20 204 lb 12.8 oz (92.9 kg)    Physical Exam Vitals reviewed.  Constitutional:  Appearance: Normal appearance.  HENT:     Mouth/Throat:     Mouth: Mucous membranes are moist.  Eyes:     General: No scleral icterus.    Conjunctiva/sclera: Conjunctivae normal.  Cardiovascular:     Rate and Rhythm: Regular rhythm. Bradycardia present.     Heart sounds: Normal heart sounds, S1 normal and S2 normal. No murmur heard.    No friction rub. No gallop.     Comments: EKG- SB, 58 bpm No LVH, Q waves, or ST/T wave changes  Pulmonary:      Effort: Pulmonary effort is normal. No tachypnea, accessory muscle usage or respiratory distress.     Breath sounds: Examination of the right-middle field reveals wheezing and rhonchi. Examination of the right-lower field reveals wheezing and rhonchi. Wheezing and rhonchi present. No decreased breath sounds or rales.  Abdominal:     General: Abdomen is flat.     Palpations: There is no mass.     Tenderness: There is no abdominal tenderness. There is no guarding.     Hernia: No hernia is present. There is no hernia in the left inguinal area or right inguinal area.  Genitourinary:    Pubic Area: No rash.      Penis: Normal and circumcised.      Epididymis:     Right: Normal.     Left: Normal.     Prostate: Enlarged. Not tender and no nodules present.     Rectum: Normal. Guaiac result negative. No mass, tenderness, anal fissure, external hemorrhoid or internal hemorrhoid. Normal anal tone.  Musculoskeletal:     Cervical back: Neck supple.     Right lower leg: No edema.     Left lower leg: No edema.  Lymphadenopathy:     Cervical: No cervical adenopathy.     Lower Body: No right inguinal adenopathy. No left inguinal adenopathy.  Skin:    General: Skin is warm and dry.     Findings: No rash.  Neurological:     General: No focal deficit present.     Mental Status: He is alert. Mental status is at baseline.  Psychiatric:        Mood and Affect: Mood normal.        Behavior: Behavior normal.     Lab Results  Component Value Date   WBC 7.8 12/04/2022   HGB 16.8 12/04/2022   HCT 49.5 12/04/2022   PLT 280.0 12/04/2022   GLUCOSE 106 (H) 12/04/2022   CHOL 185 12/04/2022   TRIG 164.0 (H) 12/04/2022   HDL 41.10 12/04/2022   LDLDIRECT 94.0 12/14/2020   LDLCALC 111 (H) 12/04/2022   ALT 13 12/04/2022   AST 15 12/04/2022   NA 141 12/04/2022   K 4.9 12/04/2022   CL 103 12/04/2022   CREATININE 0.96 12/04/2022   BUN 20 12/04/2022   CO2 29 12/04/2022   TSH 0.73 12/04/2022   PSA  0.44 12/04/2022   HGBA1C 5.7 12/14/2020    DG Chest 2 View Result Date: 04/15/2018 CLINICAL DATA:  Chronic cough.  Wheezing.  Smoker. EXAM: CHEST - 2 VIEW COMPARISON:  None. FINDINGS: The heart size and mediastinal contours are within normal limits. Both lungs are clear. Pulmonary hyperinflation noted, consistent with COPD. The visualized skeletal structures are unremarkable. IMPRESSION: COPD.  No active cardiopulmonary disease. Electronically Signed   By: Norleen Kil M.D.   On: 04/15/2018 23:27      Assessment & Plan:  Colon cancer screening -     Ambulatory  referral to Gastroenterology  Tobacco abuse -     Ambulatory Referral for Lung Cancer Scre  Simple chronic bronchitis (HCC) -     Umeclidinium-Vilanterol; Inhale 1 puff into the lungs daily at 6 (six) AM.  Dispense: 120 each; Refill: 0  Benign prostatic hyperplasia without lower urinary tract symptoms -     PSA; Future  Dyslipidemia, goal LDL below 130 -     Lipid panel; Future -     TSH; Future -     Hepatic function panel; Future  Asymptomatic microscopic hematuria -     Urinalysis, Routine w reflex microscopic; Future  Primary hypertension -     EKG 12-Lead -     CBC with Differential/Platelet; Future -     Basic metabolic panel with GFR; Future  Hyperglycemia -     Hemoglobin A1c; Future -     Basic metabolic panel with GFR; Future  GERD without esophagitis -     Esomeprazole  Magnesium ; Take 1 capsule (40 mg total) by mouth daily.  Dispense: 90 capsule; Refill: 0     Follow-up: Return in about 3 months (around 03/10/2024).  Debby Molt, MD

## 2023-12-09 NOTE — Patient Instructions (Signed)
 Hypertension, Adult High blood pressure (hypertension) is when the force of blood pumping through the arteries is too strong. The arteries are the blood vessels that carry blood from the heart throughout the body. Hypertension forces the heart to work harder to pump blood and may cause arteries to become narrow or stiff. Untreated or uncontrolled hypertension can lead to a heart attack, heart failure, a stroke, kidney disease, and other problems. A blood pressure reading consists of a higher number over a lower number. Ideally, your blood pressure should be below 120/80. The first ("top") number is called the systolic pressure. It is a measure of the pressure in your arteries as your heart beats. The second ("bottom") number is called the diastolic pressure. It is a measure of the pressure in your arteries as the heart relaxes. What are the causes? The exact cause of this condition is not known. There are some conditions that result in high blood pressure. What increases the risk? Certain factors may make you more likely to develop high blood pressure. Some of these risk factors are under your control, including: Smoking. Not getting enough exercise or physical activity. Being overweight. Having too much fat, sugar, calories, or salt (sodium) in your diet. Drinking too much alcohol. Other risk factors include: Having a personal history of heart disease, diabetes, high cholesterol, or kidney disease. Stress. Having a family history of high blood pressure and high cholesterol. Having obstructive sleep apnea. Age. The risk increases with age. What are the signs or symptoms? High blood pressure may not cause symptoms. Very high blood pressure (hypertensive crisis) may cause: Headache. Fast or irregular heartbeats (palpitations). Shortness of breath. Nosebleed. Nausea and vomiting. Vision changes. Severe chest pain, dizziness, and seizures. How is this diagnosed? This condition is diagnosed by  measuring your blood pressure while you are seated, with your arm resting on a flat surface, your legs uncrossed, and your feet flat on the floor. The cuff of the blood pressure monitor will be placed directly against the skin of your upper arm at the level of your heart. Blood pressure should be measured at least twice using the same arm. Certain conditions can cause a difference in blood pressure between your right and left arms. If you have a high blood pressure reading during one visit or you have normal blood pressure with other risk factors, you may be asked to: Return on a different day to have your blood pressure checked again. Monitor your blood pressure at home for 1 week or longer. If you are diagnosed with hypertension, you may have other blood or imaging tests to help your health care provider understand your overall risk for other conditions. How is this treated? This condition is treated by making healthy lifestyle changes, such as eating healthy foods, exercising more, and reducing your alcohol intake. You may be referred for counseling on a healthy diet and physical activity. Your health care provider may prescribe medicine if lifestyle changes are not enough to get your blood pressure under control and if: Your systolic blood pressure is above 130. Your diastolic blood pressure is above 80. Your personal target blood pressure may vary depending on your medical conditions, your age, and other factors. Follow these instructions at home: Eating and drinking  Eat a diet that is high in fiber and potassium, and low in sodium, added sugar, and fat. An example of this eating plan is called the DASH diet. DASH stands for Dietary Approaches to Stop Hypertension. To eat this way: Eat  plenty of fresh fruits and vegetables. Try to fill one half of your plate at each meal with fruits and vegetables. Eat whole grains, such as whole-wheat pasta, brown rice, or whole-grain bread. Fill about one  fourth of your plate with whole grains. Eat or drink low-fat dairy products, such as skim milk or low-fat yogurt. Avoid fatty cuts of meat, processed or cured meats, and poultry with skin. Fill about one fourth of your plate with lean proteins, such as fish, chicken without skin, beans, eggs, or tofu. Avoid pre-made and processed foods. These tend to be higher in sodium, added sugar, and fat. Reduce your daily sodium intake. Many people with hypertension should eat less than 1,500 mg of sodium a day. Do not drink alcohol if: Your health care provider tells you not to drink. You are pregnant, may be pregnant, or are planning to become pregnant. If you drink alcohol: Limit how much you have to: 0-1 drink a day for women. 0-2 drinks a day for men. Know how much alcohol is in your drink. In the [place] one drink equals one 12 oz bottle of beer (355 mL), one 5 oz glass of wine (148 mL), or one 1 oz glass of hard liquor (44 mL). Lifestyle  Work with your health care provider to maintain a healthy body weight or to lose weight. Ask what an ideal weight is for you. Get at least 30 minutes of exercise that causes your heart to beat faster (aerobic exercise) most days of the week. Activities may include walking, swimming, or biking. Include exercise to strengthen your muscles (resistance exercise), such as Pilates or lifting weights, as part of your weekly exercise routine. Try to do these types of exercises for 30 minutes at least 3 days a week. Do not use any products that contain nicotine or tobacco. These products include cigarettes, chewing tobacco, and vaping devices, such as e-cigarettes. If you need help quitting, ask your health care provider. Monitor your blood pressure at home as told by your health care provider. Keep all follow-up visits. This is important. Medicines Take over-the-counter and prescription medicines only as told by your health care provider. Follow directions carefully. Blood  pressure medicines must be taken as prescribed. Do not skip doses of blood pressure medicine. Doing this puts you at risk for problems and can make the medicine less effective. Ask your health care provider about side effects or reactions to medicines that you should watch for. Contact a health care provider if you: Think you are having a reaction to a medicine you are taking. Have headaches that keep coming back (recurring). Feel dizzy. Have swelling in your ankles. Have trouble with your vision. Get help right away if you: Develop a severe headache or confusion. Have unusual weakness or numbness. Feel faint. Have severe pain in your chest or abdomen. Vomit repeatedly. Have trouble breathing. These symptoms may be an emergency. Get help right away. Call 911. Do not wait to see if the symptoms will go away. Do not drive yourself to the hospital. Summary Hypertension is when the force of blood pumping through your arteries is too strong. If this condition is not controlled, it may put you at risk for serious complications. Your personal target blood pressure may vary depending on your medical conditions, your age, and other factors. For most people, a normal blood pressure is less than 120/80. Hypertension is treated with lifestyle changes, medicines, or a combination of both. Lifestyle changes include losing weight, eating a healthy,  low-sodium diet, exercising more, and limiting alcohol. This information is not intended to replace advice given to you by your health care provider. Make sure you discuss any questions you have with your health care provider. Document Revised: 03/13/2021 Document Reviewed: 03/13/2021 Elsevier Patient Education  2024 [employer].

## 2023-12-10 ENCOUNTER — Ambulatory Visit: Payer: Self-pay | Admitting: Internal Medicine

## 2023-12-10 LAB — URINALYSIS, ROUTINE W REFLEX MICROSCOPIC
Ketones, ur: NEGATIVE
Leukocytes,Ua: NEGATIVE
Nitrite: NEGATIVE
Specific Gravity, Urine: 1.025 (ref 1.000–1.030)
Total Protein, Urine: NEGATIVE
Urine Glucose: NEGATIVE
Urobilinogen, UA: 0.2 (ref 0.0–1.0)
WBC, UA: NONE SEEN (ref 0–?)
pH: 6 (ref 5.0–8.0)

## 2023-12-10 MED ORDER — ROSUVASTATIN CALCIUM 10 MG PO TABS: 10.0000 mg | ORAL_TABLET | Freq: Every day | ORAL | 1 refills | Status: AC

## 2023-12-10 MED ORDER — OLMESARTAN MEDOXOMIL 20 MG PO TABS: 20.0000 mg | ORAL_TABLET | Freq: Every day | ORAL | 1 refills | Status: AC

## 2023-12-11 DIAGNOSIS — K219 Gastro-esophageal reflux disease without esophagitis: Secondary | ICD-10-CM | POA: Insufficient documentation

## 2023-12-11 DIAGNOSIS — R5383 Other fatigue: Secondary | ICD-10-CM | POA: Insufficient documentation

## 2023-12-11 NOTE — Assessment & Plan Note (Signed)
 Will evaluate for CAD

## 2023-12-12 ENCOUNTER — Encounter: Admitting: Adult Health

## 2023-12-15 ENCOUNTER — Encounter (HOSPITAL_COMMUNITY): Payer: Self-pay

## 2023-12-17 ENCOUNTER — Ambulatory Visit (INDEPENDENT_AMBULATORY_CARE_PROVIDER_SITE_OTHER): Admitting: *Deleted

## 2023-12-17 ENCOUNTER — Telehealth (HOSPITAL_COMMUNITY): Payer: Self-pay | Admitting: Emergency Medicine

## 2023-12-17 ENCOUNTER — Encounter: Payer: Self-pay | Admitting: *Deleted

## 2023-12-17 ENCOUNTER — Ambulatory Visit (HOSPITAL_COMMUNITY)

## 2023-12-17 DIAGNOSIS — F17210 Nicotine dependence, cigarettes, uncomplicated: Secondary | ICD-10-CM | POA: Diagnosis not present

## 2023-12-17 NOTE — Telephone Encounter (Signed)
 Attempted to call patient regarding upcoming cardiac CT appointment. Left message on voicemail with name and callback number Rockwell Alexandria RN Navigator Cardiac Imaging [facility] [phone] Office [phone] Cell

## 2023-12-17 NOTE — Patient Instructions (Signed)

## 2023-12-17 NOTE — Progress Notes (Addendum)
  Virtual Visit via Telephone Note  I connected with Donald Chavez on 12/17/23 at  9:00 AM EDT by telephone and verified that I am speaking with the correct person using two identifiers.  Location: Patient: Donald Chavez Provider: Laneta Speaks, RN   I discussed the limitations, risks, security and privacy concerns of performing an evaluation and management service by telephone and the availability of in person appointments. I also discussed with the patient that there may be a patient responsible charge related to this service. The patient expressed understanding and agreed to proceed.  Shared Decision Making Visit Lung Cancer Screening Program [phone])   Eligibility: Age 58 y.o. Pack Years Smoking History Calculation 80 (# packs/per year x # years smoked) Recent History of coughing up blood  no Unexplained weight loss? no ( >Than 15 pounds within the last 6 months ) Prior History Lung / other cancer no (Diagnosis within the last 5 years already requiring surveillance chest CT Scans). Smoking Status Current Smoker Former Smokers: Years since quit: n/a  Quit Date: n/a  Visit Components: Discussion included one or more decision making aids. yes Discussion included risk/benefits of screening. yes Discussion included potential follow up diagnostic testing for abnormal scans. yes Discussion included meaning and risk of over diagnosis. yes Discussion included meaning and risk of False Positives. yes Discussion included meaning of total radiation exposure. yes  Counseling Included: Importance of adherence to annual lung cancer LDCT screening. yes Impact of comorbidities on ability to participate in the program. yes Ability and willingness to under diagnostic treatment. yes  Smoking Cessation Counseling: Current Smokers:  Discussed importance of smoking cessation. yes Information about tobacco cessation classes and interventions provided to patient. yes Patient provided with  ticket for LDCT Scan. yes Symptomatic Patient. no  Counseling(Intermediate counseling: > three minutes) 99406 Spent 3 1/2 minutes intermediate counseling patient on importance of smoking cessation.  Diagnosis Code: Tobacco Use Z72.0 Asymptomatic Patient yes  Counseling (Intermediate counseling: > three minutes counseling) H9563 Former Smokers:  Discussed the importance of maintaining cigarette abstinence. yes Diagnosis Code: Personal History of Nicotine Dependence. S12.108 Information about tobacco cessation classes and interventions provided to patient. Yes Patient provided with ticket for LDCT Scan. no Written Order for Lung Cancer Screening with LDCT placed in Epic. Yes (CT Chest Lung Cancer Screening Low Dose W/O CM) [id number] Z12.2-Screening of respiratory organs Z87.891-Personal history of nicotine dependence   Laneta Speaks, RN

## 2023-12-18 ENCOUNTER — Ambulatory Visit (HOSPITAL_COMMUNITY)
Admission: RE | Admit: 2023-12-18 | Discharge: 2023-12-18 | Disposition: A | Discharge status: Home / Self Care | Source: Ambulatory Visit | Attending: Cardiology | Admitting: Cardiology

## 2023-12-18 ENCOUNTER — Telehealth: Payer: Self-pay | Admitting: *Deleted

## 2023-12-18 ENCOUNTER — Ambulatory Visit (HOSPITAL_BASED_OUTPATIENT_CLINIC_OR_DEPARTMENT_OTHER)
Admission: RE | Admit: 2023-12-18 | Discharge: 2023-12-18 | Disposition: A | Discharge status: Home / Self Care | Source: Ambulatory Visit | Attending: Internal Medicine | Admitting: Internal Medicine

## 2023-12-18 ENCOUNTER — Other Ambulatory Visit: Payer: Self-pay | Admitting: Internal Medicine

## 2023-12-18 DIAGNOSIS — Z122 Encounter for screening for malignant neoplasm of respiratory organs: Secondary | ICD-10-CM | POA: Insufficient documentation

## 2023-12-18 DIAGNOSIS — I2510 Atherosclerotic heart disease of native coronary artery without angina pectoris: Secondary | ICD-10-CM | POA: Insufficient documentation

## 2023-12-18 DIAGNOSIS — I700 Atherosclerosis of aorta: Secondary | ICD-10-CM

## 2023-12-18 DIAGNOSIS — F17210 Nicotine dependence, cigarettes, uncomplicated: Secondary | ICD-10-CM | POA: Diagnosis not present

## 2023-12-18 DIAGNOSIS — E785 Hyperlipidemia, unspecified: Secondary | ICD-10-CM | POA: Insufficient documentation

## 2023-12-18 DIAGNOSIS — R5383 Other fatigue: Secondary | ICD-10-CM | POA: Insufficient documentation

## 2023-12-18 DIAGNOSIS — Z87891 Personal history of nicotine dependence: Secondary | ICD-10-CM | POA: Insufficient documentation

## 2023-12-18 MED ORDER — NITROGLYCERIN 0.4 MG SL SUBL
0.8000 mg | SUBLINGUAL_TABLET | Freq: Once | SUBLINGUAL | Status: AC
  Administered 2023-12-18: 0.8 mg via SUBLINGUAL

## 2023-12-18 MED ORDER — IOHEXOL 350 MG/ML SOLN
100.0000 mL | Freq: Once | INTRAVENOUS | Status: AC | PRN
  Administered 2023-12-18: 100 mL via INTRAVENOUS

## 2023-12-18 MED ORDER — ASPIRIN 81 MG PO TBEC: 81.0000 mg | DELAYED_RELEASE_TABLET | Freq: Every day | ORAL | 1 refills | Status: AC

## 2023-12-18 NOTE — Progress Notes (Signed)
 Care Guide Pharmacy Note  12/18/2023 Name: [name] MRN: [id number] DOB: May 18, 1966  Referred By: Joshua Debby CROME, MD Reason for referral: Complex Care Management (Outreach to schedule referral with pharmacist )   Donald Chavez is a 58 y.o. year old male who is a primary care patient of Joshua Debby CROME, MD.  [name] was referred to the pharmacist for assistance related to: HTN  Successful contact was made with the patient to discuss pharmacy services including being ready for the pharmacist to call at least 5 minutes before the scheduled appointment time and to have medication bottles and any blood pressure readings ready for review. The patient agreed to meet with the pharmacist via telephone visit on 01/01/2024  Thedford Franks, CMA, Care Guide [facility], [facility] Guide Direct Dial: [phone]  Fax: [phone] Website: [email or web address]

## 2024-01-01 ENCOUNTER — Other Ambulatory Visit: Admitting: Pharmacist

## 2024-01-01 ENCOUNTER — Other Ambulatory Visit: Payer: Self-pay

## 2024-01-01 ENCOUNTER — Other Ambulatory Visit (HOSPITAL_COMMUNITY): Payer: Self-pay

## 2024-01-01 DIAGNOSIS — I10 Essential (primary) hypertension: Secondary | ICD-10-CM

## 2024-01-01 DIAGNOSIS — J410 Simple chronic bronchitis: Secondary | ICD-10-CM

## 2024-01-01 DIAGNOSIS — E785 Hyperlipidemia, unspecified: Secondary | ICD-10-CM

## 2024-01-01 MED ORDER — UMECLIDINIUM-VILANTEROL 62.5-25 MCG/ACT IN AEPB
1.0000 | INHALATION_SPRAY | Freq: Every day | RESPIRATORY_TRACT | 0 refills | Status: DC
  Filled 2024-01-01: qty 60, 30d supply, fill #0
  Filled 2024-02-24: qty 60, 30d supply, fill #1

## 2024-01-01 NOTE — Progress Notes (Signed)
 01/01/2024 Name: Donald Chavez MRN: [id number] DOB: 1965-11-08  Chief Complaint  Patient presents with   Medication Management   Hyperlipidemia   Hypertension   COPD    CLEMON Chavez is a 58 y.o. year old male who presented for a telephone visit.   They were referred to the pharmacist by their PCP for assistance in managing their hypertension, hyperlipidemia, and COPD.    Subjective:  Care Team: Primary Care Provider: Joshua Debby CROME, MD ; Next Scheduled Visit: N/A  Medication Access/Adherence  Current Pharmacy:  [facility] - [place] - [address] [place] [address] Phone: [phone] Fax: [phone]   Patient reports affordability concerns with their medications: Yes  Patient reports access/transportation concerns to their pharmacy: No  Patient reports adherence concerns with their medications:  No     Hypertension:  Current medications: Olmesartan  20 mg daily (reported that he is not taking) Medications previously tried: Indapamide   Patient has a validated, automated, upper arm home BP cuff Current blood pressure readings readings: States at home readings are around 130/80, reports a reading of 127/80  Hyperlipidemia/ASCVD Risk Reduction  Current lipid lowering medications: Rosuvastatin  10mg  daily Medications tried in the past: None  Antiplatelet regimen: Aspirin  81mg  daily   ASCVD History: Recent mild CAD diagnosis on cardiac CT  COPD Current medications: Albuterol  108 MCG/ACT Medications tried in the past: Anoro Ellipta  62.5-25mg  (stopped due to cost at independent pharmacy)   Objective:  Lab Results  Component Value Date   HGBA1C 6.0 12/09/2023    Lab Results  Component Value Date   CREATININE 0.90 12/09/2023   BUN 18 12/09/2023   NA 139 12/09/2023   K 4.1 12/09/2023   CL 102 12/09/2023   CO2 30 12/09/2023    Lab Results  Component Value Date   CHOL 167 12/09/2023   HDL 43.00 12/09/2023    LDLCALC 92 12/09/2023   LDLDIRECT 94.0 12/14/2020   TRIG 159.0 (H) 12/09/2023   CHOLHDL 4 12/09/2023    Medications Reviewed Today     Reviewed by [place] Dixon, Student-PharmD (Student-PharmD) on 01/01/24 at 1312  Med List Status: <None>   Medication Order Taking? Sig Documenting Provider Last Dose Status Informant  albuterol  (VENTOLIN  HFA) 108 (90 Base) MCG/ACT inhaler [id number] Yes INHALE 2 PUFFS INTO THE LUNGS EVERY 6 HOURS AS NEEDED FOR WHEEZING OR SHORTNESS OF BREATH. SHAKE WELL BEFORE EACH USE. Donald Debby CROME, MD  Active   aspirin  EC 81 MG tablet [id number] Yes Take 1 tablet (81 mg total) by mouth daily. Donald Debby CROME, MD  Active   esomeprazole  (NEXIUM ) 40 MG capsule [id number] Yes Take 1 capsule (40 mg total) by mouth daily.  Patient taking differently: Take 1 capsule (40 mg total) by mouth daily.   Donald Debby CROME, MD  Active   olmesartan  (BENICAR ) 20 MG tablet [id number]  Take 1 tablet (20 mg total) by mouth daily. Donald Debby CROME, MD  Active   rosuvastatin  (CRESTOR ) 10 MG tablet [id number] Yes Take 1 tablet (10 mg total) by mouth daily. Donald Debby CROME, MD  Active   tadalafil  (CIALIS ) 20 MG tablet [id number]  Take 0.5-1 tablets (10-20 mg total) by mouth once a week.  Patient not taking: Reported on 12/09/2023   Donald Debby CROME, MD  Active   umeclidinium-vilanterol (ANORO ELLIPTA ) 62.5-25 MCG/ACT AEPB [id number]  Inhale 1 puff into the lungs daily at 6 (six) AM.  Patient not taking: Reported on 01/01/2024   Donald,  Debby CROME, MD  Active               Assessment/Plan:   Hypertension: - Currently uncontrolled, Goal <130/80  - Reviewed appropriate blood pressure monitoring technique and reviewed goal blood pressure. Recommended to check home blood pressure and heart rate  - Recommended to start taking Olmesartan  20mg  daily   Hyperlipidemia/ASCVD Risk Reduction: - Currently uncontrolled. LDL goal <70 - Continue Rosuvastatin  10mg  daily - Continue Aspirin  81mg  daily     COPD: - Currently uncontrolled.  - Reviewed need for a daily inhaler to control his COPD and decrease progression - No documentation of PFTs - Spoke to his pharmacy about the cost of Anoro Ellipta  - [facility] unable to fill due to reimbursement cost, prescription sent to [facility] for home delivery   - Sent number to schedule colonoscopy from referral.   Follow Up Plan: Phone call on 9/4   Lum Ricks, PharmD Candidate  [facility], [employer]     Darrelyn Drum, PharmD, [employer], CPP Clinical Pharmacist Practitioner [place] [facility] [phone]

## 2024-01-01 NOTE — Patient Instructions (Addendum)
 It was a pleasure speaking with you today!  Start taking Olmesartan  20mg  daily. Continue checking your blood pressure at home.   Continue Rosuvastatin  10mg  daily and Aspirin  81mg  daily.   I sent a prescription for Anoro Ellipta  to [facility] for home delivery.   The number to schedule your colonoscopy is [phone].  I will call you on 9/4 to follow-up on your blood pressure.   Feel free to call with any questions or concerns!  Lum Ricks, PharmD Candidate  [employer], [employer]    Darrelyn Drum, PharmD, [employer], CPP Clinical Pharmacist Practitioner [place] [facility] at [facility] [phone]

## 2024-01-02 ENCOUNTER — Other Ambulatory Visit: Payer: Self-pay

## 2024-01-06 ENCOUNTER — Other Ambulatory Visit (HOSPITAL_COMMUNITY): Payer: Self-pay

## 2024-01-06 ENCOUNTER — Other Ambulatory Visit: Payer: Self-pay

## 2024-01-07 ENCOUNTER — Other Ambulatory Visit: Payer: Self-pay

## 2024-01-22 ENCOUNTER — Other Ambulatory Visit (INDEPENDENT_AMBULATORY_CARE_PROVIDER_SITE_OTHER): Admitting: Pharmacist

## 2024-01-22 DIAGNOSIS — J410 Simple chronic bronchitis: Secondary | ICD-10-CM

## 2024-01-22 DIAGNOSIS — E785 Hyperlipidemia, unspecified: Secondary | ICD-10-CM

## 2024-01-22 DIAGNOSIS — I10 Essential (primary) hypertension: Secondary | ICD-10-CM

## 2024-01-22 NOTE — Patient Instructions (Addendum)
 It was a pleasure speaking with you today!  Continue aspirin  81 mg daily, olmesartan  20 mg daily, rosuvastatin  10 mg daily, and Anoro Ellipta  1 puff daily.  Feel free to call with any questions or concerns!  Darrelyn Drum, PharmD, BCPS, CPP Clinical Pharmacist Practitioner [place] [facility] at [facility] [phone]

## 2024-01-22 NOTE — Progress Notes (Signed)
 01/22/2024 Name: Donald Chavez MRN: [id number] DOB: 04/27/1966  Chief Complaint  Patient presents with   Hypertension   Medication Management    Donald Chavez is a 58 y.o. year old male who presented for a telephone visit.   They were referred to the pharmacist by their PCP for assistance in managing their hypertension, hyperlipidemia, and COPD.    Subjective:  Care Team: Primary Care Provider: Joshua Debby CROME, MD ; Next Scheduled Visit: N/A  Medication Access/Adherence  Current Pharmacy:  [facility] - [place],  - [address]  [place] [address] Phone: [phone] Fax: [phone]  [facility] [address] [place] [address] Phone: [phone] Fax: [phone]   Patient reports affordability concerns with their medications: Yes  Patient reports access/transportation concerns to their pharmacy: No  Patient reports adherence concerns with their medications:  No     Hypertension:  Current medications: Olmesartan  20 mg daily Medications previously tried: Indapamide   Patient has a validated, automated, upper arm home BP cuff Current blood pressure readings readings: States at home readings are around 130/80  Hyperlipidemia/ASCVD Risk Reduction  Current lipid lowering medications: Rosuvastatin  10mg  daily Medications tried in the past: None  Antiplatelet regimen: Aspirin  81mg  daily   ASCVD History: Recent mild CAD diagnosis on cardiac CT  COPD Current medications: Albuterol  108 MCG/ACT, Anoro Ellipta  daily  Medications tried in the past: Anoro Ellipta  62.5-25mg  (stopped due to cost at independent pharmacy)   Pt was able to receive Anoro from [facility] delivery for $4. He could not get it before at his regular pharmacy due to the cost to the pharmacy being too high  Objective:  Lab Results  Component Value Date   HGBA1C 6.0 12/09/2023    Lab Results  Component Value Date    CREATININE 0.90 12/09/2023   BUN 18 12/09/2023   NA 139 12/09/2023   K 4.1 12/09/2023   CL 102 12/09/2023   CO2 30 12/09/2023    Lab Results  Component Value Date   CHOL 167 12/09/2023   HDL 43.00 12/09/2023   LDLCALC 92 12/09/2023   LDLDIRECT 94.0 12/14/2020   TRIG 159.0 (H) 12/09/2023   CHOLHDL 4 12/09/2023    Medications Reviewed Today     Reviewed by Merceda Lela SAUNDERS, RPH (Pharmacist) on 01/22/24 at 1321  Med List Status: <None>   Medication Order Taking? Sig Documenting Provider Last Dose Status Informant  albuterol  (VENTOLIN  HFA) 108 (90 Base) MCG/ACT inhaler [id number]  INHALE 2 PUFFS INTO THE LUNGS EVERY 6 HOURS AS NEEDED FOR WHEEZING OR SHORTNESS OF BREATH. SHAKE WELL BEFORE EACH USE. Joshua Debby CROME, MD  Active   aspirin  EC 81 MG tablet [id number]  Take 1 tablet (81 mg total) by mouth daily. Joshua Debby CROME, MD  Active   esomeprazole  (NEXIUM ) 40 MG capsule [id number]  Take 1 capsule (40 mg total) by mouth daily.  Patient taking differently: Take 1 capsule (40 mg total) by mouth daily.   Joshua Debby CROME, MD  Active   olmesartan  (BENICAR ) 20 MG tablet [id number] Yes Take 1 tablet (20 mg total) by mouth daily. Joshua Debby CROME, MD  Active   rosuvastatin  (CRESTOR ) 10 MG tablet [id number] Yes Take 1 tablet (10 mg total) by mouth daily. Joshua Debby CROME, MD  Active   tadalafil  (CIALIS ) 20 MG tablet [id number]  Take 0.5-1 tablets (10-20 mg total) by mouth once a week.  Patient not taking: Reported on 12/09/2023  Joshua Debby CROME, MD  Active   umeclidinium-vilanterol (ANORO ELLIPTA ) 62.5-25 MCG/ACT AEPB [id number] Yes Inhale 1 puff into the lungs daily at 6 (six) AM. Joshua Debby CROME, MD  Active               Assessment/Plan:   Hypertension: - Currently borderline uncontrolled, Goal <130/80  - Reviewed appropriate blood pressure monitoring technique and reviewed goal blood pressure. Recommended to check home blood pressure and heart rate  - Recommended to continue taking  Olmesartan  20mg  daily   Hyperlipidemia/ASCVD Risk Reduction: - Currently uncontrolled. LDL goal <70 - Continue Rosuvastatin  10mg  daily (recent new start) - Continue Aspirin  81mg  daily    ?COPD: - Currently uncontrolled.  - Reviewed need for a daily inhaler to control his COPD and decrease progression - No documentation of PFTs    Follow Up Plan: PRN    Darrelyn Drum, PharmD, BCPS, CPP Clinical Pharmacist Practitioner [place] [facility] at [facility] [phone]

## 2024-02-24 ENCOUNTER — Other Ambulatory Visit: Payer: Self-pay

## 2024-03-11 ENCOUNTER — Encounter: Payer: Self-pay | Admitting: Internal Medicine

## 2024-03-18 ENCOUNTER — Ambulatory Visit
Admission: EM | Admit: 2024-03-18 | Discharge: 2024-03-18 | Disposition: A | Discharge status: Home / Self Care | Attending: Nurse Practitioner | Admitting: Nurse Practitioner

## 2024-03-18 ENCOUNTER — Other Ambulatory Visit: Payer: Self-pay

## 2024-03-18 DIAGNOSIS — R110 Nausea: Secondary | ICD-10-CM | POA: Diagnosis not present

## 2024-03-18 DIAGNOSIS — R531 Weakness: Secondary | ICD-10-CM

## 2024-03-18 DIAGNOSIS — R55 Syncope and collapse: Secondary | ICD-10-CM

## 2024-03-18 DIAGNOSIS — B349 Viral infection, unspecified: Secondary | ICD-10-CM | POA: Diagnosis not present

## 2024-03-18 DIAGNOSIS — R52 Pain, unspecified: Secondary | ICD-10-CM | POA: Diagnosis not present

## 2024-03-18 DIAGNOSIS — R509 Fever, unspecified: Secondary | ICD-10-CM

## 2024-03-18 LAB — POCT URINE DIPSTICK
Bilirubin, UA: NEGATIVE
Glucose, UA: NEGATIVE mg/dL
Ketones, POC UA: NEGATIVE mg/dL
Leukocytes, UA: NEGATIVE
Nitrite, UA: NEGATIVE
POC PROTEIN,UA: NEGATIVE
Spec Grav, UA: 1.015 (ref 1.010–1.025)
Urobilinogen, UA: 0.2 U/dL
pH, UA: 5.5 (ref 5.0–8.0)

## 2024-03-18 LAB — POC COVID19/FLU A&B COMBO
Covid Antigen, POC: NEGATIVE
Influenza A Antigen, POC: NEGATIVE
Influenza B Antigen, POC: NEGATIVE

## 2024-03-18 NOTE — Discharge Instructions (Addendum)
 The COVID/flu test was negative. A urine culture and lab work are pending.  You will be contacted if the pending test results are abnormal.  You will also have access to the results via MyChart. Take medication as prescribed. Increase your fluids and allow for plenty of rest.  Recommend drinking Pedialyte or Gatorade to help decrease risk for dehydration. You may take over-the-counter Tylenol as needed for pain, fever, or general discomfort. Recommend a BRAT diet to include bananas, rice, applesauce, and toast while your symptoms persist. Go to the emergency department immediately if you experience worsening weakness, nausea, or develop new symptoms of chest pain, shortness of breath, or difficulty breathing. If your symptoms fail to improve, would like for you to follow-up with your primary care physician for further evaluation. Follow-up as needed.

## 2024-03-18 NOTE — ED Triage Notes (Signed)
 Pt reports headache, body aches, nausea, fatigue, body aches, since Friday. Dizziness, and hot flashes that led him to blacking out last night.

## 2024-03-18 NOTE — ED Provider Notes (Signed)
 [facility]    CSN: [id number] Arrival date & time: 03/18/24  1228      History   Chief Complaint No chief complaint on file.   HPI Donald Chavez is a 58 y.o. male.   The history is provided by the patient.   Patient presents with a several day history of generalized weakness, nausea, headache, and bodyaches.  Patient states last evening, he developed dizziness and hot flashes, states that he had a brief episode of blacking out.  Patient states that the episode was brief, states he could tell when he hit the floor.  Patient denies chest pain, shortness of breath, difficulty breathing, vomiting, diarrhea, or rash.  States he has not been eating or drinking as much due to his ongoing nausea.  He denies any obvious close sick contacts.  Per review of chart, patient with prior history of CAD, tobacco abuse, and marijuana abuse.  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 No pertinent surgical history.     Home Medications    Prior to Admission medications   Medication Sig Start Date End Date  Taking? Authorizing Provider  albuterol  (VENTOLIN  HFA) 108 (90 Base) MCG/ACT inhaler INHALE 2 PUFFS INTO THE LUNGS EVERY 6 HOURS AS NEEDED FOR WHEEZING OR SHORTNESS OF BREATH. SHAKE WELL BEFORE EACH USE. 09/15/21   Joshua Debby CROME, MD  aspirin  EC 81 MG tablet Take 1 tablet (81 mg total) by mouth daily. 12/18/23   Joshua Debby CROME, MD  esomeprazole  (NEXIUM ) 40 MG capsule Take 1 capsule (40 mg total) by mouth daily. Patient taking differently: Take 1 capsule (40 mg total) by mouth daily. 12/09/23   Joshua Debby CROME, MD  olmesartan  (BENICAR ) 20 MG tablet Take 1 tablet (20 mg total) by mouth daily. 12/10/23   Joshua Debby CROME, MD  rosuvastatin  (CRESTOR ) 10 MG tablet Take 1 tablet (10 mg total) by mouth daily. 12/10/23   Joshua Debby CROME, MD  tadalafil  (CIALIS ) 20 MG tablet Take 0.5-1 tablets (10-20 mg total) by mouth once a week. Patient not taking: Reported on 12/09/2023 12/14/20   Joshua Debby CROME, MD  umeclidinium-vilanterol (ANORO ELLIPTA ) 62.5-25 MCG/ACT AEPB Inhale 1 puff into the lungs daily at 6 (six) AM. 01/01/24   Joshua Debby CROME, MD    Family History Family History  Problem Relation Age of Onset   Hypertension Mother    COPD Father    CAD Father    Alcoholism Father     Social History Social History   Tobacco Use   Smoking status: Every Day    Current packs/day: 2.00    Average packs/day: 2.0 packs/day for 40.8 years (81.7  ttl pk-yrs)    Types: Cigarettes    Start date: 1985   Smokeless tobacco: Never  Substance Use Topics   Alcohol use: Yes    Alcohol/week: 5.0 standard drinks of alcohol    Types: 5 Shots of liquor per week   Drug use: Yes    Types: Marijuana     Allergies   Penicillins   Review of Systems Review of Systems Per HPI  Physical Exam Triage Vital Signs ED Triage Vitals  Encounter Vitals Group     BP 03/18/24 1239 124/65     Girls Systolic BP Percentile --      Girls Diastolic BP Percentile --      Boys Systolic BP Percentile --      Boys Diastolic BP  Percentile --      Pulse Rate 03/18/24 1239 62     Resp 03/18/24 1239 16     Temp 03/18/24 1239 98.1 F (36.7 C)     Temp Source 03/18/24 1239 Oral     SpO2 03/18/24 1239 95 %     Weight --      Height --      Head Circumference --      Peak Flow --      Pain Score 03/18/24 1243 0     Pain Loc --      Pain Education --      Exclude from Growth Chart --    Orthostatic VS for the past 24 hrs:  BP- Lying Pulse- Lying BP- Sitting Pulse- Sitting BP- Standing at 0 minutes Pulse- Standing at 0 minutes  03/18/24 1320 110/60 56 120/64 70 107/64 74    Updated Vital Signs BP 124/65 (BP Location: Right Arm)   Pulse 62   Temp 98.1 F (36.7 C) (Oral)   Resp 16   SpO2 95%   Visual Acuity Right Eye Distance:   Left Eye Distance:   Bilateral Distance:    Right Eye Near:   Left Eye Near:    Bilateral Near:     Physical Exam Vitals and nursing note reviewed.  Constitutional:      General: He is not in acute distress.    Appearance: Normal appearance.  HENT:     Head: Normocephalic.     Right Ear: Tympanic membrane, ear canal and external ear normal.     Left Ear: Tympanic membrane, ear canal and external ear normal.     Nose: Nose normal.     Mouth/Throat:     Mouth: Mucous membranes are moist.  Eyes:     Extraocular Movements: Extraocular movements intact.     Conjunctiva/sclera: Conjunctivae normal.     Pupils: Pupils are equal, round, and reactive to light.  Cardiovascular:     Rate and Rhythm: Normal rate and regular rhythm.     Pulses: Normal pulses.     Heart sounds: Normal heart sounds.  Pulmonary:     Effort: Pulmonary effort is normal. No respiratory distress.     Breath sounds: Normal breath sounds. No stridor. No wheezing, rhonchi or rales.  Abdominal:     General: Bowel sounds are normal.     Palpations: Abdomen is soft.     Tenderness: There is no abdominal tenderness.  Musculoskeletal:     Cervical back: Normal range of motion.  Skin:    General: Skin is  warm and dry.  Neurological:     General: No focal deficit present.     Mental Status: He is alert  and oriented to person, place, and time.  Psychiatric:        Mood and Affect: Mood normal.        Behavior: Behavior normal.      UC Treatments / Results  Labs (all labs ordered are listed, but only abnormal results are displayed) Labs Reviewed  POCT URINE DIPSTICK - Abnormal; Notable for the following components:      Result Value   Blood, UA trace-intact (*)    All other components within normal limits  CBC WITH DIFFERENTIAL/PLATELET  BASIC METABOLIC PANEL WITH GFR  POC COVID19/FLU A&B COMBO    EKG: Normal sinus rhythm with sinus arrhythmia, no STEMI.   Radiology No results found.  Procedures Procedures (including critical care time)  Medications Ordered in UC Medications - No data to display  Initial Impression / Assessment and Plan / UC Course  I have reviewed the triage vital signs and the nursing notes.  Pertinent labs & imaging results that were available during my care of the patient were reviewed by me and considered in my medical decision making (see chart for details).  COVID/flu test was negative, urinalysis was negative for infection, but does show trace blood, urine culture is pending.  EKG shows normal sinus rhythm, orthostatic vitals were also negative.  Will collect CBC and BMP for safety.  Given the patient's current presentation and history, workup does not reveal an obvious etiology for the patient's symptoms, although are consistent with viral etiology.  Patient did experience a syncopal episode, EKG was negative, orthostatic vital signs were also negative, blood work is pending to determine if etiology presents.  Will provide symptomatic treatment for patient's nausea with ondansetron 4 mg ODT.  Supportive care recommendations were provided discussed with the patient to include fluids, rest, and a BRAT diet until nausea improves.  Patient with underlying  history of heart disease.  Patient was advised if symptoms fail to improve, it is recommended that he follow-up in the emergency department for further evaluation.  Patient was advised to follow-up with his PCP if symptoms fail to improve over the next 5 to 7 days.  Patient was in agreement with this plan of care and verbalizes understanding.  All questions were answered.  Patient stable for discharge.  Final Clinical Impressions(s) / UC Diagnoses   Final diagnoses:  Generalized weakness  Nausea without vomiting  Body aches  Viral illness  Syncope, unspecified syncope type     Discharge Instructions      The COVID/flu test was negative. A urine culture and lab work are pending.  You will be contacted if the pending test results are abnormal.  You will also have access to the results via MyChart. Take medication as prescribed. Increase your fluids and allow for plenty of rest.  Recommend drinking Pedialyte or Gatorade to help decrease risk for dehydration. You may take over-the-counter Tylenol as needed for pain, fever, or general discomfort. Recommend a BRAT diet to include bananas, rice, applesauce, and toast while your symptoms persist. Go to the emergency department immediately if you experience worsening weakness, nausea, or develop new symptoms of chest pain, shortness of breath, or difficulty breathing. If your symptoms fail to improve, would like for you to follow-up with your primary care physician for further evaluation. Follow-up as needed.     ED Prescriptions   None    PDMP not reviewed this encounter.   Gilmer Etta PARAS, NP 03/18/24 1340

## 2024-03-19 ENCOUNTER — Encounter: Payer: Self-pay | Admitting: Internal Medicine

## 2024-03-19 ENCOUNTER — Ambulatory Visit (HOSPITAL_COMMUNITY): Payer: Self-pay

## 2024-03-19 LAB — CBC WITH DIFFERENTIAL/PLATELET
Basophils Absolute: 0.1 x10E3/uL (ref 0.0–0.2)
Basos: 1 %
EOS (ABSOLUTE): 0.1 x10E3/uL (ref 0.0–0.4)
Eos: 1 %
Hematocrit: 47.7 % (ref 37.5–51.0)
Hemoglobin: 15.9 g/dL (ref 13.0–17.7)
Immature Grans (Abs): 0.1 x10E3/uL (ref 0.0–0.1)
Immature Granulocytes: 1 %
Lymphocytes Absolute: 2.7 x10E3/uL (ref 0.7–3.1)
Lymphs: 25 %
MCH: 31.1 pg (ref 26.6–33.0)
MCHC: 33.3 g/dL (ref 31.5–35.7)
MCV: 93 fL (ref 79–97)
Monocytes Absolute: 1 x10E3/uL — ABNORMAL HIGH (ref 0.1–0.9)
Monocytes: 9 %
Neutrophils Absolute: 7.1 x10E3/uL — ABNORMAL HIGH (ref 1.4–7.0)
Neutrophils: 63 %
Platelets: 331 x10E3/uL (ref 150–450)
RBC: 5.11 x10E6/uL (ref 4.14–5.80)
RDW: 11.9 % (ref 11.6–15.4)
WBC: 11.2 x10E3/uL — ABNORMAL HIGH (ref 3.4–10.8)

## 2024-03-19 LAB — BASIC METABOLIC PANEL WITH GFR
BUN/Creatinine Ratio: 18 (ref 9–20)
BUN: 22 mg/dL (ref 6–24)
CO2: 23 mmol/L (ref 20–29)
Calcium: 9.1 mg/dL (ref 8.7–10.2)
Chloride: 100 mmol/L (ref 96–106)
Creatinine, Ser: 1.22 mg/dL (ref 0.76–1.27)
Glucose: 94 mg/dL (ref 70–99)
Potassium: 4.3 mmol/L (ref 3.5–5.2)
Sodium: 139 mmol/L (ref 134–144)
eGFR: 68 mL/min/1.73 (ref >59)

## 2024-03-19 LAB — URINE CULTURE: Culture: NO GROWTH

## 2024-04-10 ENCOUNTER — Other Ambulatory Visit: Payer: Self-pay | Admitting: Internal Medicine

## 2024-04-10 DIAGNOSIS — J410 Simple chronic bronchitis: Secondary | ICD-10-CM

## 2024-04-14 ENCOUNTER — Other Ambulatory Visit (HOSPITAL_COMMUNITY): Payer: Self-pay

## 2024-04-14 MED ORDER — UMECLIDINIUM-VILANTEROL 62.5-25 MCG/ACT IN AEPB
1.0000 | INHALATION_SPRAY | Freq: Every day | RESPIRATORY_TRACT | 0 refills | Status: AC
  Filled 2024-04-14: qty 60, 60d supply, fill #0
  Filled 2024-06-08: qty 60, 30d supply, fill #1

## 2024-04-19 ENCOUNTER — Other Ambulatory Visit: Payer: Self-pay | Admitting: Internal Medicine

## 2024-04-19 DIAGNOSIS — R310 Gross hematuria: Secondary | ICD-10-CM | POA: Insufficient documentation

## 2024-04-28 ENCOUNTER — Inpatient Hospital Stay: Admission: RE | Admit: 2024-04-28 | Discharge: 2024-04-28 | Attending: Internal Medicine | Admitting: Internal Medicine

## 2024-04-28 DIAGNOSIS — K5730 Diverticulosis of large intestine without perforation or abscess without bleeding: Secondary | ICD-10-CM | POA: Diagnosis not present

## 2024-04-28 DIAGNOSIS — R310 Gross hematuria: Secondary | ICD-10-CM | POA: Diagnosis not present

## 2024-04-30 ENCOUNTER — Ambulatory Visit: Payer: Self-pay | Admitting: Internal Medicine

## 2024-06-08 ENCOUNTER — Other Ambulatory Visit: Payer: Self-pay

## 2024-06-09 ENCOUNTER — Other Ambulatory Visit: Payer: Self-pay

## 2024-06-14 ENCOUNTER — Other Ambulatory Visit: Payer: Self-pay
